# Patient Record
Sex: Female | Born: 2000
Health system: Southern US, Community
[De-identification: ages and names within clinical notes are randomized; demographics above are authoritative.]

## PROBLEM LIST (undated history)

## (undated) DIAGNOSIS — J45909 Unspecified asthma, uncomplicated: Secondary | ICD-10-CM

## (undated) DIAGNOSIS — I10 Essential (primary) hypertension: Secondary | ICD-10-CM

## (undated) DIAGNOSIS — E119 Type 2 diabetes mellitus without complications: Secondary | ICD-10-CM

## (undated) HISTORY — DX: Type 2 diabetes mellitus without complications: E11.9

## (undated) HISTORY — DX: Unspecified asthma, uncomplicated: J45.909

## (undated) HISTORY — DX: Essential (primary) hypertension: I10

---
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
## 2019-01-02 ENCOUNTER — Telehealth (INDEPENDENT_AMBULATORY_CARE_PROVIDER_SITE_OTHER): Payer: Self-pay | Admitting: *Deleted

## 2019-01-02 NOTE — Telephone Encounter (Signed)
Spoke to patient. Advised her that we received paperwork for a Medtronic pump. She states she filled out paperwork on all the pumps but she wants the Omnipod since its tubeless. I advise her I will let Lorena know and I will inform [employer] as well.

## 2019-01-04 ENCOUNTER — Emergency Department (HOSPITAL_COMMUNITY)
Admission: EM | Admit: 2019-01-04 | Discharge: 2019-01-04 | Disposition: A | Discharge status: Home / Self Care | Payer: 59 | Attending: Emergency Medicine | Admitting: Emergency Medicine

## 2019-01-04 ENCOUNTER — Encounter (HOSPITAL_COMMUNITY): Payer: Self-pay | Admitting: Emergency Medicine

## 2019-01-04 ENCOUNTER — Other Ambulatory Visit: Payer: Self-pay

## 2019-01-04 DIAGNOSIS — Z79899 Other long term (current) drug therapy: Secondary | ICD-10-CM | POA: Insufficient documentation

## 2019-01-04 DIAGNOSIS — Z794 Long term (current) use of insulin: Secondary | ICD-10-CM | POA: Insufficient documentation

## 2019-01-04 DIAGNOSIS — J45909 Unspecified asthma, uncomplicated: Secondary | ICD-10-CM | POA: Diagnosis not present

## 2019-01-04 DIAGNOSIS — B9689 Other specified bacterial agents as the cause of diseases classified elsewhere: Secondary | ICD-10-CM

## 2019-01-04 DIAGNOSIS — E119 Type 2 diabetes mellitus without complications: Secondary | ICD-10-CM | POA: Insufficient documentation

## 2019-01-04 DIAGNOSIS — N760 Acute vaginitis: Secondary | ICD-10-CM

## 2019-01-04 DIAGNOSIS — R102 Pelvic and perineal pain: Secondary | ICD-10-CM | POA: Diagnosis present

## 2019-01-04 LAB — CBG MONITORING, ED: Glucose-Capillary: 284 mg/dL — ABNORMAL HIGH (ref 70–99)

## 2019-01-04 LAB — URINALYSIS, ROUTINE W REFLEX MICROSCOPIC
Bacteria, UA: NONE SEEN
Bilirubin Urine: NEGATIVE
Glucose, UA: >=500 mg/dL — AB
Ketones, ur: NEGATIVE mg/dL
Leukocytes,Ua: NEGATIVE
Nitrite: NEGATIVE
Protein, ur: NEGATIVE mg/dL
Specific Gravity, Urine: 1.039 — ABNORMAL HIGH (ref 1.005–1.030)
pH: 6 (ref 5.0–8.0)

## 2019-01-04 LAB — WET PREP, GENITAL
Sperm: NONE SEEN
Trich, Wet Prep: NONE SEEN
Yeast Wet Prep HPF POC: NONE SEEN

## 2019-01-04 LAB — I-STAT BETA HCG BLOOD, ED (MC, WL, AP ONLY): I-stat hCG, quantitative: <5 m[IU]/mL (ref <5)

## 2019-01-04 MED ORDER — METRONIDAZOLE 500 MG PO TABS: 500.0000 mg | ORAL_TABLET | Freq: Two times a day (BID) | ORAL | 0 refills | Status: DC

## 2019-01-04 MED ORDER — KETOCONAZOLE 2 % EX CREA: 1.0000 "application " | TOPICAL_CREAM | Freq: Two times a day (BID) | CUTANEOUS | 0 refills | Status: DC

## 2019-01-04 NOTE — ED Triage Notes (Signed)
Patient complaining of burning when she urinates. Patient states it is cuts on her vagina. Patient states this has been going for a week.

## 2019-01-04 NOTE — Discharge Instructions (Addendum)
Do not drink alcohol with this medication (flagyl) as it can make you violently ill. Manage your blood sugars to avoid infections Use condoms with your partner.

## 2019-01-04 NOTE — ED Notes (Signed)
Patient reports vaginal pain and itching; also reports boyfriend has some "bumps on penis" but not diagnosed with anything

## 2019-01-04 NOTE — ED Notes (Signed)
Bed: WA23 Expected date:  Expected time:  Means of arrival:  Comments: 

## 2019-01-05 LAB — RPR: RPR Ser Ql: NONREACTIVE

## 2019-01-05 LAB — HIV ANTIBODY (ROUTINE TESTING W REFLEX): HIV Screen 4th Generation wRfx: NONREACTIVE

## 2019-01-07 NOTE — ED Provider Notes (Signed)
[place] DEPT Provider Note   CSN: [id number] Arrival date & time: 01/04/19  1924    History   Chief Complaint Chief Complaint  Patient presents with  . Vaginal Pain    HPI Sherri Lopez is a 18 y.o. female.  Who presents the emergency department with chief complaint of vaginal discomfort.  She has a past medical history of recurrent major depressive disorder and poorly controlled insulin-dependent diabetes.  The patient states that a few days ago she noticed burning while she urinates.  She examined her genitals in the mirror and states that she said it looks like "there is cuts" to her vaginal tissue.  She is sexually active with a single female partner.  She does not use barrier protection.  He has noticed blisters on the end of his penis over the last 2 days and she is concerned she might have herpes.  She denies other vaginal complaints.  She states that her blood sugars at home have been "okay" but she is unable to give me a specific value.     HPI  Past Medical History:  Diagnosis Date  . Asthma   . Diabetes mellitus without complication [facility])     Patient Active Problem List   Diagnosis Date Noted  . Uncontrolled type 2 diabetes mellitus with insulin therapy ([place]) 05/31/2016  . Ovarian cyst 05/04/2016  . Asthma 09/30/2015  . BMI, pediatric > 99% for age 31/15/2017  . Secondary amenorrhea 09/30/2015  . Eczema 09/30/2015  . [name] nigricans 09/30/2015  . Hyperandrogenism 09/30/2015  . Acne vulgaris 09/30/2015  . Hirsutism 09/30/2015    History reviewed. No pertinent surgical history.   OB History    Gravida  0   Para  0   Term  0   Preterm  0   AB  0   Living  0     SAB  0   TAB  0   Ectopic  0   Multiple  0   Live Births  0            Home Medications    Prior to Admission medications   Medication Sig Start Date End Date Taking? Authorizing Provider  ACCU-CHEK FASTCLIX LANCETS MISC 1 each by Does  not apply route as directed. Check sugar 6 x daily 08/29/17   Lelon Huh, MD  albuterol (PROVENTIL HFA;VENTOLIN HFA) 108 (90 Base) MCG/ACT inhaler Inhale 1-2 puffs into the lungs every 6 (six) hours as needed for wheezing or shortness of breath. Reported on 02/23/2016    [provider]  amLODipine (NORVASC) 5 MG tablet Take 5 mg by mouth daily.  12/13/17   [provider]  BD PEN NEEDLE NANO U/F 32G X 4 MM MISC USE TO INJECT INSULIN VIA INSULIN PEN 6 TIMES DAILY 12/19/16   Lelon Huh, MD  Continuous Blood Gluc Sensor (DEXCOM G6 SENSOR) MISC 1 each by Does not apply route as directed. 1 sensor every 10 days 09/13/18   Lelon Huh, MD  Continuous Blood Gluc Transmit (DEXCOM G6 TRANSMITTER) MISC 1 each by Does not apply route every 3 (three) months. 09/13/18   Lelon Huh, MD  glucagon 1 MG injection Use for Severe Hypoglycemia . Inject 1 mg intramuscularly if unresponsive, unable to swallow, unconscious and/or has seizure Patient not taking: Reported on 09/13/2018 04/18/17   Lelon Huh, MD  glucose blood (ACCU-CHEK GUIDE) test strip Check Blood sugar 6x daily 08/28/17   Lelon Huh, MD  insulin degludec (TRESIBA  FLEXTOUCH) 100 UNIT/ML SOPN FlexTouch Pen Use up to 50 units daily 12/04/18   Lelon Huh, MD  insulin lispro (HUMALOG KWIKPEN) 100 UNIT/ML KwikPen Up to 50 units/day 11/16/18   Lelon Huh, MD  ketoconazole (NIZORAL) 2 % cream Apply 1 application topically 2 (two) times daily. To the affected area for 10 days 01/04/19   Margarita Mail, PA-C  medroxyPROGESTERone (PROVERA) 10 MG tablet Take 1 tablet (10 mg total) by mouth daily. x10 days per month instead of placebo pills. 05/30/17   Lelon Huh, MD  metFORMIN (GLUCOPHAGE XR) 750 MG 24 hr tablet Take 1 tablet (750 mg total) by mouth 2 (two) times daily. Take 1 tablet twice daily 05/30/17   Lelon Huh, MD  metroNIDAZOLE (FLAGYL) 500 MG tablet Take 1 tablet (500 mg total) by mouth 2 (two) times daily.  One po bid x 7 days 01/04/19   Margarita Mail, PA-C  spironolactone (ALDACTONE) 50 MG tablet Take 1 tablet (50 mg total) by mouth 2 (two) times daily. 05/30/17   Lelon Huh, MD  Libby 28 0.25-35 MG-MCG tablet TAKE 1 TABLET BY MOUTH EVERY DAY 11/15/18   Lelon Huh, MD  triamcinolone cream (KENALOG) 0.1 % Apply 1 application topically 2 (two) times daily as needed (for itching/irritation).     [provider]  Vitamin D, Ergocalciferol, (DRISDOL) 50000 units CAPS capsule TAKE 1 CAPSULE (50,000 UNITS TOTAL) BY MOUTH EVERY 7 (SEVEN) DAYS. Patient not taking: Reported on 12/28/2017 06/05/17   Trude Mcburney, FNP    Family History Family History  Adopted: Yes  Problem Relation Age of Onset  . Polycystic ovary syndrome Mother     Social History Social History   Tobacco Use  . Smoking status: Never Smoker  . Smokeless tobacco: Never Used  Substance Use Topics  . Alcohol use: No    Alcohol/week: 0.0 standard drinks  . Drug use: No     Allergies   Penicillins   Review of Systems Review of Systems Ten systems reviewed and are negative for acute change, except as noted in the HPI.    Physical Exam Updated Vital Signs BP 139/86 (BP Location: Left Arm)   Pulse 79   Temp 99.3 F (37.4 C) (Oral)   Resp 15   Ht 5\' 2"  (1.575 m)   Wt 80.7 kg   LMP 12/04/2018   SpO2 99%   BMI 32.56 kg/m   Physical Exam Physical Exam  Nursing note and vitals reviewed. Constitutional: She is oriented to person, place, and time. She appears well-developed and well-nourished. No distress.  HENT:  Head: Normocephalic and atraumatic.  Eyes: Conjunctivae normal and EOM are normal. Pupils are equal, round, and reactive to light. No scleral icterus.  Neck: Normal range of motion.  Cardiovascular: Normal rate, regular rhythm and normal heart sounds.  Exam reveals no gallop and no friction rub.   No murmur heard. Pulmonary/Chest: Effort normal and breath sounds normal. No  respiratory distress.  Abdominal: Soft. Bowel sounds are normal. She exhibits no distension and no mass. There is no tenderness. There is no guarding. Genitourinary: Normal vulva without lesions.  There is erythematous, irritated appearing tissue at the base of the labia minora bilaterally and at the introitus.  No blisters present..  Normal, pink vaginal walls with thin white discharge.  Nulliparous cervical loss without discharge or cervical motion tenderness.  No adnexal fullness or tenderness. Neurological: She is alert and oriented to person, place, and time.  Skin: Skin is warm and dry. She is  not diaphoretic.     ED Treatments / Results  Labs (all labs ordered are listed, but only abnormal results are displayed) Labs Reviewed  WET PREP, GENITAL - Abnormal; Notable for the following components:      Result Value   Clue Cells Wet Prep HPF POC PRESENT (*)    WBC, Wet Prep HPF POC FEW (*)    All other components within normal limits  URINALYSIS, ROUTINE W REFLEX MICROSCOPIC - Abnormal; Notable for the following components:   Color, Urine STRAW (*)    Specific Gravity, Urine 1.039 (*)    Glucose, UA >=500 (*)    Hgb urine dipstick LARGE (*)    All other components within normal limits  CBG MONITORING, ED - Abnormal; Notable for the following components:   Glucose-Capillary 284 (*)    All other components within normal limits  RPR  HIV ANTIBODY (ROUTINE TESTING W REFLEX)  POC URINE PREG, ED  I-STAT BETA HCG BLOOD, ED ([facility], [facility], [facility] ONLY)  GC/CHLAMYDIA PROBE AMP ([place]) NOT AT [facility]    EKG None  Radiology No results found.  Procedures Procedures (including critical care time)  Medications Ordered in ED Medications - No data to display   Initial Impression / Assessment and Plan / ED Course  I have reviewed the triage vital signs and the nursing notes.  Pertinent labs & imaging results that were available during my care of the patient were reviewed by me and  considered in my medical decision making (see chart for details).         18 year old female who presents with concern for burning with urination, and sexual partner with recent outbreak of suspected HSV.  On my examination I do not see any vesicular eruptions.  I have reviewed the patient's labs which show elevated blood glucose, clue cells on the wet prep, mild hemoglobinuria and elevated glucose urea.  UA does not appear infected.  She is a negative pregnancy test.  Refresh patient will be treated today for bacterial vaginosis.  Her current examination is not appear to show HSV outbreak.  I have warned the patient of the signs.  The rest of her lab work is currently pending.  She was appropriate for discharge with outpatient GYN follow-up, she should follow-up with her PCP for optimize blood sugar control.  I discussed safe sex practices with her partner.  Final Clinical Impressions(s) / ED Diagnoses   Final diagnoses:  Bacterial vaginosis  Acute vaginitis    ED Discharge Orders         Ordered    metroNIDAZOLE (FLAGYL) 500 MG tablet  2 times daily     01/04/19 2303    ketoconazole (NIZORAL) 2 % cream  2 times daily     01/04/19 2303           Margarita Mail, PA-C 01/07/19 1351    Malvin Johns, MD 01/07/19 1501

## 2019-01-08 LAB — GC/CHLAMYDIA PROBE AMP (~~LOC~~) NOT AT ARMC
Chlamydia: NEGATIVE
Neisseria Gonorrhea: NEGATIVE

## 2019-01-24 ENCOUNTER — Telehealth (INDEPENDENT_AMBULATORY_CARE_PROVIDER_SITE_OTHER): Payer: Self-pay | Admitting: Pediatric Endocrinology

## 2019-01-24 NOTE — Telephone Encounter (Signed)
°  Who's calling (name and relationship to patient) : Cyril Mourning [employer] account planner [employer]) Best contact number: [phone] Provider they see: Baldo Ash Reason for call: [facility] needs a letter from Dr. Baldo Ash stating the reasons that Hermine would benefit  from an insulin pump.  Please call with any questions.  [employer] [phone]    PRESCRIPTION REFILL ONLY  Name of prescription:  Pharmacy:

## 2019-01-29 ENCOUNTER — Encounter (INDEPENDENT_AMBULATORY_CARE_PROVIDER_SITE_OTHER): Payer: Self-pay | Admitting: Pediatric Endocrinology

## 2019-01-29 NOTE — Telephone Encounter (Signed)
I'm working on this letter- but I am unclear if [facility] wants a letter saying WHY OMNIPOD or WHY PUMP?  Thanks

## 2019-01-29 NOTE — Telephone Encounter (Signed)
I'm working on this letter- but I am unclear if [facility] wants a letter saying WHY OMNIPOD or WHY PUMP?  Thanks

## 2019-01-30 NOTE — Telephone Encounter (Signed)
I'm working on this letter- but I am unclear if [facility] wants a letter saying WHY OMNIPOD or WHY PUMP?  Thanks

## 2019-01-31 ENCOUNTER — Encounter (INDEPENDENT_AMBULATORY_CARE_PROVIDER_SITE_OTHER): Payer: Self-pay | Admitting: Pediatric Endocrinology

## 2019-01-31 NOTE — Telephone Encounter (Signed)
Yes- I saw it- will try to finish today- I will leave it on your desk.

## 2019-01-31 NOTE — Telephone Encounter (Signed)
Did you get the email from Sherri Lopez, she sent you a template, read her the letter you had prepared and she said it sounds great, but to add what she stated on the letter.

## 2019-02-06 NOTE — Telephone Encounter (Signed)
Completed by Dr. Baldo Ash and sent out by Adair Laundry

## 2019-02-11 ENCOUNTER — Telehealth (INDEPENDENT_AMBULATORY_CARE_PROVIDER_SITE_OTHER): Payer: Self-pay | Admitting: Pediatric Endocrinology

## 2019-02-11 NOTE — Telephone Encounter (Signed)
[employer] is not able to get in touch with Sherri Lopez about insurance approving Omnipod for her.

## 2019-02-12 NOTE — Telephone Encounter (Signed)
LVM on [place] VM advised that [employer] has not been able to reach her, she needs to call them to discuss the pump.

## 2019-02-19 ENCOUNTER — Telehealth (INDEPENDENT_AMBULATORY_CARE_PROVIDER_SITE_OTHER): Payer: Self-pay | Admitting: Pediatric Endocrinology

## 2019-02-19 NOTE — Telephone Encounter (Signed)
°  Who's calling (name and relationship to patient) : Olivia Mackie with [employer]  Best contact number: [phone]  Provider they see: Baldo Ash  Reason for call: Olivia Mackie stated she sent our office a form to be completed for patient to receive a [employer] pump. Please advise if we received this form.      PRESCRIPTION REFILL ONLY  Name of prescription:  Pharmacy:

## 2019-02-19 NOTE — Telephone Encounter (Signed)
Spoke to [place], she advises Sherri Lopez wants a [employer] pump, yet we have done the paperwork for an Omnipod, I have attempted to call [place] and she has no VM set up.  [place] just an [place].

## 2019-02-21 ENCOUNTER — Telehealth (INDEPENDENT_AMBULATORY_CARE_PROVIDER_SITE_OTHER): Payer: Self-pay | Admitting: *Deleted

## 2019-02-21 ENCOUNTER — Telehealth (INDEPENDENT_AMBULATORY_CARE_PROVIDER_SITE_OTHER): Payer: Self-pay | Admitting: Pediatric Endocrinology

## 2019-02-21 NOTE — Telephone Encounter (Signed)
Spoke to mother, advised that I have been trying to reach [place] about which insulin pump she wants. She does not have the VM set up on her phone. Mother advises she will call [place] and have her call after 2 and speak to me.

## 2019-02-21 NOTE — Telephone Encounter (Signed)
°  Who's calling (name and relationship to patient) : Lovena Le (Self)  Best contact number: [phone] Provider they see: Dr. Baldo Ash  Reason for call: Pt would like a return call from Dr. Baldo Ash at her earliest convenience.

## 2019-02-21 NOTE — Telephone Encounter (Signed)
Returned TC to [place], she said that because of the insurance situation, the [employer]onic pump is the only one and more affordable that she can use. She kept saying that she had to use the this pump. Tried to explain to patient that she does not have to go or start on any insulin pump. Also advised her that we hear a lot of patients that are not happy with the [place] sensor. She said this is what she can afford.

## 2019-02-25 NOTE — Telephone Encounter (Signed)
No, [employer] exclusively [employer]'s [employer] is out of the table. [employer] will not argue with [employer].

## 2019-02-25 NOTE — Telephone Encounter (Signed)
It is fine for her to use a [employer]tronic pump- can we call [employer] and ask if there is a work around? I think I would prefer her to be on their system if at all possible.   Dr. Baldo Ash

## 2019-02-25 NOTE — Telephone Encounter (Signed)
Ok- then if she wants a pump CGM combo I guess we're going [employer].Sherri KitchenMarland Lopez

## 2019-03-08 ENCOUNTER — Telehealth (INDEPENDENT_AMBULATORY_CARE_PROVIDER_SITE_OTHER): Payer: Self-pay | Admitting: Pediatric Endocrinology

## 2019-03-08 NOTE — Telephone Encounter (Signed)
°  Who's calling (name and relationship to patient) : [employer]  Best contact number: [phone]. [phone]   Provider they see: Baldo Ash   Reason for call: Stated they faxed [place] a CMN on 02/18/19 for patient to receive a pump. Have not received back from [place] yet.      PRESCRIPTION REFILL ONLY  Name of prescription:  Pharmacy:

## 2019-03-11 NOTE — Telephone Encounter (Signed)
Routed to LI 

## 2019-03-12 NOTE — Telephone Encounter (Signed)
LVM to advise that we have not seen patient in over 6 mos and provider will not sign CMN until patient seen in office.

## 2019-04-05 ENCOUNTER — Ambulatory Visit (INDEPENDENT_AMBULATORY_CARE_PROVIDER_SITE_OTHER): Payer: Self-pay | Admitting: Pediatric Endocrinology

## 2020-06-15 ENCOUNTER — Other Ambulatory Visit (INDEPENDENT_AMBULATORY_CARE_PROVIDER_SITE_OTHER): Payer: Self-pay | Admitting: Pediatric Endocrinology

## 2020-06-15 DIAGNOSIS — E1111 Type 2 diabetes mellitus with ketoacidosis with coma: Secondary | ICD-10-CM

## 2020-06-15 DIAGNOSIS — Z794 Long term (current) use of insulin: Secondary | ICD-10-CM

## 2020-06-15 NOTE — Telephone Encounter (Signed)
Contacted patient and she asks that this referral be sent to [facility] endocrinology as that is where she sent the appointment request to.   Let patient know the last time we saw her for an appointment was 08/2018 which is nearing on almost two years ago. An approval from a provider would have to be made for an insulin refill. Patient states understanding and requests that she be contacted when a decision is made for her refills. Informs [phone] is the best number to contact her at. No DPR on file to leave a detailed VM at this number.

## 2020-06-15 NOTE — Telephone Encounter (Signed)
°  Who's calling (name and relationship to patient) : Shalinda, [place] Best contact number: [phone] Provider they see: Baldo Ash Reason for call: [place] would like a referral to adult endo but will need a refill for her insuline to get her to that appt that is about 3 weeks out.  Please call.    PRESCRIPTION REFILL ONLY  Name of prescription:  Pharmacy:

## 2020-06-16 ENCOUNTER — Telehealth (INDEPENDENT_AMBULATORY_CARE_PROVIDER_SITE_OTHER): Payer: Self-pay | Admitting: Pediatric Endocrinology

## 2020-06-16 NOTE — Telephone Encounter (Signed)
°  Who's calling (name and relationship to patient) :self / Selinda Flavin  Best contact number:[phone]  Provider they see:Dr. Baldo Ash   Reason for call:would like a call back to talk about getting a refill for her insulin and questions about her next appt 11/2 CG      PRESCRIPTION REFILL ONLY  Name of prescription:  Pharmacy:

## 2020-06-18 NOTE — Telephone Encounter (Signed)
Patients next appointment is not scheduled in our office.   Will route this message to provider as patient has not been seen in our office since 08/2018.

## 2020-06-22 ENCOUNTER — Other Ambulatory Visit (INDEPENDENT_AMBULATORY_CARE_PROVIDER_SITE_OTHER): Payer: Self-pay | Admitting: *Deleted

## 2020-06-22 ENCOUNTER — Telehealth (INDEPENDENT_AMBULATORY_CARE_PROVIDER_SITE_OTHER): Payer: Self-pay | Admitting: Pediatric Endocrinology

## 2020-06-22 MED ORDER — INSULIN LISPRO (1 UNIT DIAL) 100 UNIT/ML (KWIKPEN): PEN_INJECTOR | SUBCUTANEOUS | 1 refills | Status: DC

## 2020-06-22 NOTE — Telephone Encounter (Signed)
Spoke to patient, reminder her she has an appt 07/22/20 with Dr. Kelton Pillar at [facility]. Refill sent to get her to that appt. Advised her this would be the last refill and to make sure she keeps her Dec. Appt. She voiced understanding.

## 2020-06-22 NOTE — Telephone Encounter (Signed)
°  Who's calling (name and relationship to patient) Self / Selinda Flavin   Best contact number:[phone]  Provider they see:Dr. Baldo Ash   Reason for call:Needs a medication Refill for insulin if possible and would like a call back     [place]  Name of prescription:insulin   Pharmacy:[facility]

## 2020-07-22 ENCOUNTER — Ambulatory Visit: Payer: 59 | Admitting: Internal Medicine

## 2020-07-22 NOTE — Progress Notes (Deleted)
Name: Sherri Lopez  MRN/ DOB: [id number], 2001/07/27   Age/ Sex: 19 y.o., female    PCP: [name], MD   Reason for Endocrinology Evaluation: Type 2 Diabetes Mellitus     Date of Initial Endocrinology Visit: 07/22/2020     PATIENT IDENTIFIER: Sherri Lopez is a 19 y.o. female with a past medical history of ***. The patient presented for initial endocrinology clinic visit on 07/22/2020 for consultative assistance with her diabetes management.    HPI: Sherri Lopez was    Diagnosed with DM in 2017 Prior Medications tried/Intolerance: *** Currently checking blood sugars *** x / day,  before breakfast and ***.  Hypoglycemia episodes : ***               Symptoms: ***                 Frequency: ***/  Hemoglobin A1c has ranged from 8.4% in 2017, peaking at 12.8% in 2021. Patient required assistance for hypoglycemia:  Patient has required hospitalization within the last 1 year from hyper or hypoglycemia:   In terms of diet, the patient ***   Secondary Amenorrhea: She has a history of irregular menstruation and hirsutism. Menarche at age 54. elevated testosterone ranging between 132-241 . She has been on spironolactone and OCP's . She has dermoid cyst on her left ovary . Gyn has been holding surgery until A1c < 8.0%  HOME DIABETES REGIMEN: Tresiba Novolog    Statin: no ACE-I/ARB: no   METER DOWNLOAD SUMMARY: Date range evaluated: *** Fingerstick Blood Glucose Tests = *** Average Number Tests/Day = *** Overall Mean FS Glucose = *** Standard Deviation = ***  BG Ranges: Low = *** High = ***   Hypoglycemic Events/30 Days: BG < 50 = *** Episodes of symptomatic severe hypoglycemia = ***   DIABETIC COMPLICATIONS: Microvascular complications:   ***  Denies: ***  Last eye exam: Completed   Macrovascular complications:   ***  Denies: CAD, PVD, CVA   PAST HISTORY: Past Medical History:  Past Medical History:  Diagnosis Date  . Asthma   . Diabetes  mellitus without complication [facility])      Past Surgical History: No past surgical history on file.   Social History:  reports that she has never smoked. She has never used smokeless tobacco. She reports that she does not drink alcohol and does not use drugs. Family History:  Family History  Adopted: Yes  Problem Relation Age of Onset  . Polycystic ovary syndrome Mother       HOME MEDICATIONS: Allergies as of 07/22/2020      Reactions   Penicillins Rash, Other (See Comments)   Has patient had a PCN reaction causing immediate rash, facial/tongue/throat swelling, SOB or lightheadedness with hypotension: No Has patient had a PCN reaction causing severe rash involving mucus membranes or skin necrosis: No Has patient had a PCN reaction that required hospitalization No Has patient had a PCN reaction occurring within the last 10 years: No If all of the above answers are "NO", then may proceed with Cephalosporin use.      Medication List       Accurate as of July 22, 2020 12:47 PM. If you have any questions, ask your nurse or doctor.        Accu-Chek FastClix Lancets Misc 1 each by Does not apply route as directed. Check sugar 6 x daily   albuterol 108 (90 Base) MCG/ACT inhaler Commonly known as: VENTOLIN HFA Inhale 1-2 puffs  into the lungs every 6 (six) hours as needed for wheezing or shortness of breath. Reported on 02/23/2016   amLODipine 5 MG tablet Commonly known as: NORVASC Take 5 mg by mouth daily.   BD Pen Needle Nano U/F 32G X 4 MM Misc Generic drug: Insulin Pen Needle USE TO INJECT INSULIN VIA INSULIN PEN 6 TIMES DAILY   Dexcom G6 Sensor Misc 1 each by Does not apply route as directed. 1 sensor every 10 days   Dexcom G6 Transmitter Misc 1 each by Does not apply route every 3 (three) months.   glucagon 1 MG injection Use for Severe Hypoglycemia . Inject 1 mg intramuscularly if unresponsive, unable to swallow, unconscious and/or has seizure   glucose blood  test strip Commonly known as: Accu-Chek Guide Check Blood sugar 6x daily   insulin degludec 100 UNIT/ML FlexTouch Pen Commonly known as: [name] FlexTouch Use up to 50 units daily   insulin lispro 100 UNIT/ML KwikPen Commonly known as: HumaLOG KwikPen Up to 50 units/day   ketoconazole 2 % cream Commonly known as: NIZORAL Apply 1 application topically 2 (two) times daily. To the affected area for 10 days   medroxyPROGESTERone 10 MG tablet Commonly known as: PROVERA Take 1 tablet (10 mg total) by mouth daily. x10 days per month instead of placebo pills.   metFORMIN 750 MG 24 hr tablet Commonly known as: Glucophage XR Take 1 tablet (750 mg total) by mouth 2 (two) times daily. Take 1 tablet twice daily   metroNIDAZOLE 500 MG tablet Commonly known as: Flagyl Take 1 tablet (500 mg total) by mouth 2 (two) times daily. One po bid x 7 days   spironolactone 50 MG tablet Commonly known as: ALDACTONE Take 1 tablet (50 mg total) by mouth 2 (two) times daily.   Sprintec 28 0.25-35 MG-MCG tablet Generic drug: norgestimate-ethinyl estradiol TAKE 1 TABLET BY MOUTH EVERY DAY   triamcinolone 0.1 % Commonly known as: KENALOG Apply 1 application topically 2 (two) times daily as needed (for itching/irritation).   Vitamin D (Ergocalciferol) 1.25 MG (50000 UNIT) Caps capsule Commonly known as: DRISDOL TAKE 1 CAPSULE (50,000 UNITS TOTAL) BY MOUTH EVERY 7 (SEVEN) DAYS.        ALLERGIES: Allergies  Allergen Reactions  . Penicillins Rash and Other (See Comments)    Has patient had a PCN reaction causing immediate rash, facial/tongue/throat swelling, SOB or lightheadedness with hypotension: No Has patient had a PCN reaction causing severe rash involving mucus membranes or skin necrosis: No Has patient had a PCN reaction that required hospitalization No Has patient had a PCN reaction occurring within the last 10 years: No If all of the above answers are "NO", then may proceed with  Cephalosporin use.     REVIEW OF SYSTEMS: A comprehensive ROS was conducted with the patient and is negative except as per HPI and below:  ROS    OBJECTIVE:   VITAL SIGNS: There were no vitals taken for this visit.   PHYSICAL EXAM:  General: Pt appears well and is in NAD  Hydration: Well-hydrated with moist mucous membranes and good skin turgor  HEENT: Head: Unremarkable with good dentition. Oropharynx clear without exudate.  Eyes: External eye exam normal without stare, lid lag or exophthalmos.  EOM intact.  PERRL.  Neck: General: Supple without adenopathy or carotid bruits. Thyroid: Thyroid size normal.  No goiter or nodules appreciated. No thyroid bruit.  Lungs: Clear with good BS bilat with no rales, rhonchi, or wheezes  Heart: RRR with normal  S1 and S2 and no gallops; no murmurs; no rub  Abdomen: Normoactive bowel sounds, soft, nontender, without masses or organomegaly palpable  Extremities:  Lower extremities - No pretibial edema. No lesions.  Skin: Normal texture and temperature to palpation. No rash noted. No [name] nigricans/skin tags. No lipohypertrophy.  Neuro: MS is good with appropriate affect, pt is alert and Ox3    DM foot exam:    DATA REVIEWED:  Lab Results  Component Value Date   HGBA1C 12.8 (A) 09/13/2018   HGBA1C 10.3 (A) 01/15/2018   HGBA1C 9.9 08/01/2017   Lab Results  Component Value Date   LDLCALC 76 09/26/2017   CREATININE 0.75 09/13/2018   No results found for: [facility]  Lab Results  Component Value Date   CHOL 133 09/26/2017   HDL 44 (L) 09/26/2017   LDLCALC 76 09/26/2017   TRIG 52 09/26/2017   CHOLHDL 3.0 09/26/2017      Results for EMILLEE, TALSMA (MRN [id number]) as of 07/22/2020 12:51  Ref. Range 09/30/2015 15:18  DHEA-SO4 Latest Ref Range: 37 - 307 ug/dL 147  LH Latest Units: mIU/mL 12.5  FSH Latest Units: mIU/mL 5.8  Prolactin Latest Units: ng/mL 13.1  Glucose Latest Ref Range: 65 - 99 mg/dL 148 (H)  Hemoglobin A1C  Latest Ref Range: <5.7 % 8.9 (H)  Estradiol Latest Units: pg/mL 67  Estradiol, Free Latest Units: pg/mL 1.33  17-OH-Progesterone, LC/MS/MS Latest Ref Range: 16 - 283 ng/dL 212  TSH Latest Ref Range: 0.50 - 4.30 mIU/L 1.75  T4,Free(Direct) Latest Ref Range: 0.8 - 1.4 ng/dL 1.1   Normal Cosyntropin stimulation test 2017 base 8.3- 60 minutes cortisol at 35.2  17-OH cosyntropin stim test also normal with baseline 178 , 60 minute at 398  ASSESSMENT / PLAN / RECOMMENDATIONS:   1) Type *** Diabetes Mellitus, ***controlled, With*** complications - Most recent A1c of *** %. Goal A1c < *** %.  ***  Plan: GENERAL:  ***  MEDICATIONS:  ***  EDUCATION / INSTRUCTIONS:  BG monitoring instructions: Patient is instructed to check her blood sugars *** times a day, ***.  Call [facility] [facility] if: BG persistently < 70 or > 300. . I reviewed the Rule of 15 for the treatment of hypoglycemia in detail with the patient. Literature supplied.   2) Diabetic complications:   Eye: Does *** have known diabetic retinopathy.   Neuro/ Feet: Does *** have known diabetic peripheral neuropathy.  Renal: Patient does *** have known baseline CKD. She is *** on an ACEI/ARB at present.Check urine albumin/creatinine ratio yearly starting at time of diagnosis. If albuminuria is positive, treatment is geared toward better glucose, blood pressure control and use of ACE inhibitors or ARBs. Monitor electrolytes and creatinine once to twice yearly.   3) Lipids: Patient is *** on a statin.    4) Hypertension: ***  at goal of < 140/90 mmHg.       Signed electronically by: Mack Guise, MD  [facility] Endocrinology  [facility] [address] [place], [place] [address] Phone: [phone] FAX: [phone]   CC: [name], MD [place], [place] [address] Phone: [phone]  Fax:  [phone]    Return to Endocrinology clinic as below: Future Appointments  Date Time Provider [place]  07/22/2020  2:00 PM Priyah Schmuck, Melanie Crazier, MD [facility] None

## 2020-07-27 ENCOUNTER — Encounter: Payer: Self-pay | Admitting: Internal Medicine

## 2020-08-29 ENCOUNTER — Other Ambulatory Visit (INDEPENDENT_AMBULATORY_CARE_PROVIDER_SITE_OTHER): Payer: Self-pay | Admitting: Pediatric Endocrinology

## 2020-10-09 ENCOUNTER — Ambulatory Visit: Payer: 59 | Admitting: Internal Medicine

## 2020-10-09 NOTE — Progress Notes (Deleted)
Name: Sherri Lopez  MRN/ DOB: [id number], 2000-10-05   Age/ Sex: 20 y.o., female    PCP: [name], MD   Reason for Endocrinology Evaluation: Type 2 Diabetes Mellitus     Date of Initial Endocrinology Visit: 10/09/2020     PATIENT IDENTIFIER: Sherri Lopez is a 20 y.o. female with a past medical history of T2Dm, Asthma and PCOS . The patient presented for initial endocrinology clinic visit on 10/09/2020 for consultative assistance with her diabetes management.    HPI: Sherri Lopez was    Diagnosed with DM in 2017 Prior Medications tried/Intolerance: *** Currently checking blood sugars *** x / day,  before breakfast and ***.  Hypoglycemia episodes : ***               Symptoms: ***                 Frequency: ***/  Hemoglobin A1c has ranged from 8.7% in 2017, peaking at 12.8% in 2020. Patient required assistance for hypoglycemia:  Patient has required hospitalization within the last 1 year from hyper or hypoglycemia:   In terms of diet, the patient ***    HYPERANDROGENISM HISTORY  She was following with pediatric for hyperandogenism ranging 132-241 . She had a pelvic ultrasound showing left ovarian mass at 1.6x2.0x2.0 cm . She has been managed with Sprintec, metformin and    She has a negative cosyntropin stim test for non -classical with  17-OH baseline at 178  With an hour repeat of 398 Normal DHEAS and Androstenedione  HOME DIABETES REGIMEN: Tresiba  HUmalog  Metformin 750 mg XR   Statin: {Yes/No:11203} ACE-I/ARB: {YES/NO:17245} Prior Diabetic Education: {Yes/No:11203}   METER DOWNLOAD SUMMARY: Date range evaluated: *** Fingerstick Blood Glucose Tests = *** Average Number Tests/Day = *** Overall Mean FS Glucose = *** Standard Deviation = ***  BG Ranges: Low = *** High = ***   Hypoglycemic Events/30 Days: BG < 50 = *** Episodes of symptomatic severe hypoglycemia = ***   DIABETIC COMPLICATIONS: Microvascular complications:   ***  Denies:  CKD  Last eye exam: Completed   Macrovascular complications:    Denies: CAD, PVD, CVA   PAST HISTORY: Past Medical History:  Past Medical History:  Diagnosis Date  . Asthma   . Diabetes mellitus without complication [facility])      Past Surgical History: No past surgical history on file.   Social History:  reports that she has never smoked. She has never used smokeless tobacco. She reports that she does not drink alcohol and does not use drugs. Family History:  Family History  Adopted: Yes  Problem Relation Age of Onset  . Polycystic ovary syndrome Mother       HOME MEDICATIONS: Allergies as of 10/09/2020      Reactions   Penicillins Rash, Other (See Comments)   Has patient had a PCN reaction causing immediate rash, facial/tongue/throat swelling, SOB or lightheadedness with hypotension: No Has patient had a PCN reaction causing severe rash involving mucus membranes or skin necrosis: No Has patient had a PCN reaction that required hospitalization No Has patient had a PCN reaction occurring within the last 10 years: No If all of the above answers are "NO", then may proceed with Cephalosporin use.      Medication List       Accurate as of October 09, 2020  1:00 PM. If you have any questions, ask your nurse or doctor.        Accu-Chek FastClix Lancets  Misc 1 each by Does not apply route as directed. Check sugar 6 x daily   albuterol 108 (90 Base) MCG/ACT inhaler Commonly known as: VENTOLIN HFA Inhale 1-2 puffs into the lungs every 6 (six) hours as needed for wheezing or shortness of breath. Reported on 02/23/2016   amLODipine 5 MG tablet Commonly known as: NORVASC Take 5 mg by mouth daily.   BD Pen Needle Nano U/F 32G X 4 MM Misc Generic drug: Insulin Pen Needle USE TO INJECT INSULIN VIA INSULIN PEN 6 TIMES DAILY   Dexcom G6 Sensor Misc 1 each by Does not apply route as directed. 1 sensor every 10 days   Dexcom G6 Transmitter Misc 1 each by Does not apply  route every 3 (three) months.   glucagon 1 MG injection Use for Severe Hypoglycemia . Inject 1 mg intramuscularly if unresponsive, unable to swallow, unconscious and/or has seizure   glucose blood test strip Commonly known as: Accu-Chek Guide Check Blood sugar 6x daily   insulin degludec 100 UNIT/ML FlexTouch Pen Commonly known as: [name] FlexTouch Use up to 50 units daily   insulin lispro 100 UNIT/ML KwikPen Commonly known as: HumaLOG KwikPen INJECT 50 UNITS ONCE A DAY   ketoconazole 2 % cream Commonly known as: NIZORAL Apply 1 application topically 2 (two) times daily. To the affected area for 10 days   medroxyPROGESTERone 10 MG tablet Commonly known as: PROVERA Take 1 tablet (10 mg total) by mouth daily. x10 days per month instead of placebo pills.   metFORMIN 750 MG 24 hr tablet Commonly known as: Glucophage XR Take 1 tablet (750 mg total) by mouth 2 (two) times daily. Take 1 tablet twice daily   metroNIDAZOLE 500 MG tablet Commonly known as: Flagyl Take 1 tablet (500 mg total) by mouth 2 (two) times daily. One po bid x 7 days   spironolactone 50 MG tablet Commonly known as: ALDACTONE Take 1 tablet (50 mg total) by mouth 2 (two) times daily.   Sprintec 28 0.25-35 MG-MCG tablet Generic drug: norgestimate-ethinyl estradiol TAKE 1 TABLET BY MOUTH EVERY DAY   triamcinolone 0.1 % Commonly known as: KENALOG Apply 1 application topically 2 (two) times daily as needed (for itching/irritation).   Vitamin D (Ergocalciferol) 1.25 MG (50000 UNIT) Caps capsule Commonly known as: DRISDOL TAKE 1 CAPSULE (50,000 UNITS TOTAL) BY MOUTH EVERY 7 (SEVEN) DAYS.        ALLERGIES: Allergies  Allergen Reactions  . Penicillins Rash and Other (See Comments)    Has patient had a PCN reaction causing immediate rash, facial/tongue/throat swelling, SOB or lightheadedness with hypotension: No Has patient had a PCN reaction causing severe rash involving mucus membranes or skin necrosis:  No Has patient had a PCN reaction that required hospitalization No Has patient had a PCN reaction occurring within the last 10 years: No If all of the above answers are "NO", then may proceed with Cephalosporin use.     REVIEW OF SYSTEMS: A comprehensive ROS was conducted with the patient and is negative except as per HPI and below:  ROS    OBJECTIVE:   VITAL SIGNS: There were no vitals taken for this visit.   PHYSICAL EXAM:  General: Pt appears well and is in NAD  Hydration: Well-hydrated with moist mucous membranes and good skin turgor  HEENT: Head: Unremarkable with good dentition. Oropharynx clear without exudate.  Eyes: External eye exam normal without stare, lid lag or exophthalmos.  EOM intact.  PERRL.  Neck: General: Supple without adenopathy or  carotid bruits. Thyroid: Thyroid size normal.  No goiter or nodules appreciated. No thyroid bruit.  Lungs: Clear with good BS bilat with no rales, rhonchi, or wheezes  Heart: RRR with normal S1 and S2 and no gallops; no murmurs; no rub  Abdomen: Normoactive bowel sounds, soft, nontender, without masses or organomegaly palpable  Extremities:  Lower extremities - No pretibial edema. No lesions.  Skin: Normal texture and temperature to palpation. No rash noted. No [name] nigricans/skin tags. No lipohypertrophy.  Neuro: MS is good with appropriate affect, pt is alert and Ox3    DM foot exam:    DATA REVIEWED:  Lab Results  Component Value Date   HGBA1C 12.8 (A) 09/13/2018   HGBA1C 10.3 (A) 01/15/2018   HGBA1C 9.9 08/01/2017   Lab Results  Component Value Date   LDLCALC 76 09/26/2017   CREATININE 0.75 09/13/2018   No results found for: [facility]  Lab Results  Component Value Date   CHOL 133 09/26/2017   HDL 44 (L) 09/26/2017   LDLCALC 76 09/26/2017   TRIG 52 09/26/2017   CHOLHDL 3.0 09/26/2017        ASSESSMENT / PLAN / RECOMMENDATIONS:   1) Type *** Diabetes Mellitus, ***controlled, With*** complications  - Most recent A1c of *** %. Goal A1c < *** %.  ***  Plan: GENERAL:  ***  MEDICATIONS:  ***  EDUCATION / INSTRUCTIONS:  BG monitoring instructions: Patient is instructed to check her blood sugars *** times a day, ***.  Call [facility] [facility] if: BG persistently < 70 or > 300. . I reviewed the Rule of 15 for the treatment of hypoglycemia in detail with the patient. Literature supplied.   2) Diabetic complications:   Eye: Does *** have known diabetic retinopathy.   Neuro/ Feet: Does *** have known diabetic peripheral neuropathy.  Renal: Patient does *** have known baseline CKD. She is *** on an ACEI/ARB at present.Check urine albumin/creatinine ratio yearly starting at time of diagnosis. If albuminuria is positive, treatment is geared toward better glucose, blood pressure control and use of ACE inhibitors or ARBs. Monitor electrolytes and creatinine once to twice yearly.   3) Lipids: Patient is *** on a statin.    4) Hypertension: ***  at goal of < 140/90 mmHg.       Signed electronically by: Mack Guise, MD  [facility] Endocrinology  [facility] [address] [place], [place] [address] Phone: [phone] FAX: [phone]   CC: [name], MD [place], [place] [address] Phone: [phone]  Fax: [phone]    Return to Endocrinology clinic as below: Future Appointments  Date Time Provider [place]  10/09/2020  2:00 PM Myrtis Maille, Melanie Crazier, MD [facility] None

## 2021-05-17 ENCOUNTER — Other Ambulatory Visit: Payer: Self-pay

## 2021-05-17 ENCOUNTER — Inpatient Hospital Stay (HOSPITAL_COMMUNITY)
Admission: AD | Admit: 2021-05-17 | Discharge: 2021-05-17 | Disposition: A | Discharge status: Home / Self Care | Payer: 59 | Attending: Family Medicine | Admitting: Family Medicine

## 2021-05-17 DIAGNOSIS — N912 Amenorrhea, unspecified: Secondary | ICD-10-CM | POA: Insufficient documentation

## 2021-05-17 DIAGNOSIS — N926 Irregular menstruation, unspecified: Secondary | ICD-10-CM

## 2021-05-17 NOTE — Discharge Instructions (Signed)
  [place] for [employer] at [facility]  [address] [place], [place] [address]  [place] for [place] at [address], [place], [place] [address]  [place] for [place] at [facility] [address] [place], [place] [address]  [place] for [place] at [facility]  [address] [place], [place] [address]  [phone]  [place] for [place] at [facility] for [place] (First floor), [place], [place] [address]  [place] for [facility] at [place], [place], [place] [address] [place] for [place] at [facility]  [place], [place], [place] [address]  [phone]  [facility]  [address], [place], [place] [address]  [place]  [place], [place], [place] [address]  [phone]  [facility]  [address] Jaclynn Guarneri [place], [place] [address]  [place], [place] [address]  [phone]  [facility]  [address], [place], [place] [address]  [phone]  [facility]   [place], [place], [place] [address]  [phone]  [facility] [address], [place], [place] [address]   [phone]  [facility]  [address], [place], [place] [address]  [phone]

## 2021-05-17 NOTE — MAU Provider Note (Signed)
  S:   20 y.o. G0P0000 @Unknown  by LMP presents to MAU for pregnancy confirmation.  She denies abdominal pain or vaginal bleeding today.    O: BP (!) 150/80   Pulse 78   Temp 98.1 F (36.7 C)   Resp 18   Ht 5\' 2"  (1.575 m)   Wt 80.7 kg   LMP 03/27/2021   BMI 32.56 kg/m  Physical Examination: General appearance - alert, well appearing, and in no distress, oriented to person, place, and time and acyanotic, in no respiratory distress  No results found for this or any previous visit (from the past 48 hour(s)).  A: Missed menses  P: D/C home Informed pt we do not do pregnancy verifications in MAU Referred to [facility] for pregnancy test & verification Return to MAU as needed for pregnancy related emergencies  Jorje Guild, NP 11:29 AM

## 2021-05-17 NOTE — MAU Note (Signed)
Pt reports she had a positive pregnancy test at home. [facility] and they [facility] to her PCOS and type 2 Diabetes . Pt has no complaints at this time.

## 2021-05-18 ENCOUNTER — Ambulatory Visit (INDEPENDENT_AMBULATORY_CARE_PROVIDER_SITE_OTHER): Payer: 59

## 2021-05-18 VITALS — BP 135/60 | HR 65 | Ht 62.0 in | Wt 178.0 lb

## 2021-05-18 DIAGNOSIS — Z3201 Encounter for pregnancy test, result positive: Secondary | ICD-10-CM

## 2021-05-18 LAB — POCT PREGNANCY, URINE: Preg Test, Ur: POSITIVE — AB

## 2021-05-18 NOTE — Progress Notes (Signed)
I have reviewed the chart and agree with the nurse's note and plan of care for this visit.   Fatima Blank, CNM 5:11 PM

## 2021-05-18 NOTE — Progress Notes (Signed)
Pt dropped off urine today for UPT. UPT is positive.    Pt denies any vaginal bleeding, abd pain or cramps at this time. Pt states has already started taking PNV. Pt advised of Mom/baby Dyad program and is interested in enrolling. Pt enrolled into program. Pt given new OB intake appt and New OB appt. Pt also given safe medications list. Pt denies any nausea, vomiting at this time.   LMP: 03/27/2021 EDC: 01/01/2022 [redacted]w[redacted]d   Cailean Heacock, RN

## 2021-05-18 NOTE — Patient Instructions (Addendum)
Safe Medications in Pregnancy   Acne:  Benzoyl Peroxide  Salicylic Acid   Backache/Headache:  Tylenol: 2 regular strength every 4 hours OR               2 Extra strength every 6 hours   Colds/Coughs/Allergies:  Benadryl (alcohol free) 25 mg every 6 hours as needed  Breath right strips  Claritin  Cepacol throat lozenges  Chloraseptic throat spray  Cold-Eeze- up to three times per day  Cough drops, alcohol free  Flonase (by prescription only)  Guaifenesin  Mucinex  Robitussin DM (plain only, alcohol free)  Saline nasal spray/drops  Sudafed (pseudoephedrine) & Actifed * use only after [redacted] weeks gestation and if you do not have high blood pressure  Tylenol  Vicks Vaporub  Zinc lozenges  Zyrtec   Constipation:  Colace  Ducolax suppositories  Fleet enema  Glycerin suppositories  Metamucil  Milk of magnesia  Miralax  Senokot  Smooth move tea   Diarrhea:  Kaopectate  Imodium A-D   *NO pepto Bismol   Hemorrhoids:  Anusol  Anusol HC  Preparation H  Tucks   Indigestion:  Tums  Maalox  Mylanta  Zantac  Pepcid   Insomnia:  Benadryl (alcohol free) 25mg  every 6 hours as needed  Tylenol PM  Unisom, no Gelcaps   Leg Cramps:  Tums  MagGel   Nausea/Vomiting:  Bonine  Dramamine  Emetrol  Ginger extract  Sea bands  Meclizine  Nausea medication to take during pregnancy:  Unisom (doxylamine succinate 25 mg tablets) Take one tablet daily at bedtime. If symptoms are not adequately controlled, the dose can be increased to a maximum recommended dose of two tablets daily (1/2 tablet in the morning, 1/2 tablet mid-afternoon and one at bedtime).  Vitamin B6 100mg  tablets. Take one tablet twice a day (up to 200 mg per day).   Skin Rashes:  Aveeno products  Benadryl cream or 25mg  every 6 hours as needed  Calamine Lotion  1% cortisone cream   Yeast infection:  Gyne-lotrimin 7  Monistat 7    **If taking multiple medications, please check labels to avoid  duplicating the same active ingredients  **take medication as directed on the label  ** Do not exceed 4000 mg of tylenol in 24 hours  **Do not take medications that contain aspirin or ibuprofen          P[facility] [place] [facility] [place] [facility]  [place] [phone]

## 2021-05-19 ENCOUNTER — Telehealth (INDEPENDENT_AMBULATORY_CARE_PROVIDER_SITE_OTHER): Payer: 59

## 2021-05-19 DIAGNOSIS — Z3A00 Weeks of gestation of pregnancy not specified: Secondary | ICD-10-CM

## 2021-05-19 DIAGNOSIS — O0990 Supervision of high risk pregnancy, unspecified, unspecified trimester: Secondary | ICD-10-CM | POA: Insufficient documentation

## 2021-05-19 DIAGNOSIS — O169 Unspecified maternal hypertension, unspecified trimester: Secondary | ICD-10-CM

## 2021-05-19 NOTE — Progress Notes (Signed)
New OB Intake  I connected with  Sherri Lopez on 05/19/21 at 11:15 AM EDT by telephone visit and verified that I am speaking with the correct person using two identifiers. Nurse is located at [facility] and pt is located at home. Length of telephone appointment was approx. 25 minutes.  I discussed the limitations, risks, security and privacy concerns of performing an evaluation and management service by telephone and the availability of in person appointments. I also discussed with the patient that there may be a patient responsible charge related to this service. The patient expressed understanding and agreed to proceed.  I explained I am completing New OB Intake today. We discussed her EDD of 01/01/22 that is based on LMP of 03/27/21. Pt is G1/P0. I reviewed her allergies, medications, Medical/Surgical/OB history, and appropriate screenings. I informed her of [facility] services. [facility] information placed in AVS. Based on history, this is a high risk pregnancy.   Patient Active Problem List   Diagnosis Date Noted   Supervision of high risk pregnancy, antepartum 05/19/2021   Polycystic ovarian syndrome 12/13/2017   Essential hypertension 01/01/2017   Uncontrolled type 2 diabetes mellitus with insulin therapy 05/31/2016   Ovarian cyst 05/04/2016   Asthma 09/30/2015   BMI, pediatric > 99% for age 64/15/2017   Secondary amenorrhea 09/30/2015   Eczema 09/30/2015   [name] nigricans 09/30/2015   Hyperandrogenism 09/30/2015   Acne vulgaris 09/30/2015   Hirsutism 09/30/2015   Concerns addressed today  Reports taking spironolactone for hypertension; explained this is not recommended during pregnancy. Pt will return next week for BP check. Explained that we will follow BP and determine if patient needs a new blood pressure medication.   Delivery Plans:  Plans to deliver at [facility].   MyChart/Babyscripts MyChart access verified. Babyscripts instructions given and order placed.  Blood Pressure Cuff   Patient has private insurance; instructed to purchase blood pressure cuff and bring to first prenatal appt. Explained after first prenatal appt pt will check weekly and document in 70.  Anatomy US Explained first scheduled [place] will be around 19 weeks. Anatomy US scheduled for 08/10/21 at 1030.  Labs Discussed [name] genetic screening with patient. Would like both Panorama and Horizon drawn at new OB visit. Routine prenatal labs needed.  COVID Vaccine Patient has not had COVID vaccine.   Mother/ Baby Dyad Candidate?    Yes, patient has been scheduled.   Social Determinants of Health Food Insecurity: Patient denies food insecurity. WIC Referral: Patient is interested in referral to [employer].  Transportation: Patient denies transportation needs. Childcare: Discussed no children allowed at ultrasound appointments. Offered childcare services; patient declines childcare services at this time.  First visit review I reviewed new OB appt with pt. I explained she will have a provider visit with PAP smear and ob blood work. Explained pt will be seen by Ernestina Patches, MD at first visit; encounter routed to appropriate provider. Explained that patient will be seen by pregnancy navigator following visit with provider.   Annabell Howells, RN 05/19/2021  11:56 AM

## 2021-05-26 NOTE — Progress Notes (Signed)
Attestation of Attending Supervision of clinical support staff: I agree with the care provided to this patient and was available for any consultation.  I have reviewed the RN's note and chart. I was available for consult and to see the patient if needed.   Antoria Lanza Niles Markies Mowatt, MD, MPH, ABFM Attending Physician Faculty Practice- [facility]  

## 2021-05-27 ENCOUNTER — Other Ambulatory Visit: Payer: Self-pay

## 2021-05-27 ENCOUNTER — Other Ambulatory Visit: Payer: 59

## 2021-05-27 ENCOUNTER — Telehealth: Payer: Self-pay

## 2021-05-27 ENCOUNTER — Ambulatory Visit: Payer: 59

## 2021-05-27 DIAGNOSIS — O24119 Pre-existing diabetes mellitus, type 2, in pregnancy, unspecified trimester: Secondary | ICD-10-CM

## 2021-05-27 NOTE — Telephone Encounter (Signed)
Called pt to follow up on missed BP check and diabetes education appts today. Unable to leave VM because box is full. MyChart message has already been sent by front office.

## 2021-05-28 ENCOUNTER — Telehealth: Payer: Self-pay

## 2021-05-28 DIAGNOSIS — O0990 Supervision of high risk pregnancy, unspecified, unspecified trimester: Secondary | ICD-10-CM

## 2021-05-28 NOTE — Telephone Encounter (Signed)
Received call from babyscripts of elevated BP of 143/86 with no symptoms. Call placed to pt.Spoke with pt. Pt asked about BP symtpoms. Denies all. Pt retook BP and was 137/81. Pt advised to continue to monitor symptoms and retake if has headaches, dizziness, visual changes or swelling. Pt verbalized understanding.   Pt also states [facility] [place] [facility] on 10/11 and was only [redacted]w[redacted]d instead of what original LMP. Pt had EDC changed to 01/14/22. Changed in epic. Pt also had rescheduled new OB appt. Pt agreeable to plan of care.  Pt also rescheduled for Diabetes education with [facility] Dy d/t T2DM with Insulin and Metformin. Pt has appt on 06/08/21. Pt agreeable to date and time of appt.   Colletta Maryland, RN

## 2021-06-08 ENCOUNTER — Other Ambulatory Visit: Payer: 59

## 2021-06-23 ENCOUNTER — Other Ambulatory Visit: Payer: 59

## 2021-08-03 ENCOUNTER — Other Ambulatory Visit: Payer: 59 | Admitting: Registered"

## 2021-08-04 ENCOUNTER — Other Ambulatory Visit: Payer: Self-pay

## 2021-08-04 ENCOUNTER — Other Ambulatory Visit (HOSPITAL_COMMUNITY)
Admission: RE | Admit: 2021-08-04 | Discharge: 2021-08-04 | Disposition: A | Discharge status: Home / Self Care | Payer: 59 | Source: Ambulatory Visit | Attending: Family Medicine | Admitting: Family Medicine

## 2021-08-04 ENCOUNTER — Ambulatory Visit (INDEPENDENT_AMBULATORY_CARE_PROVIDER_SITE_OTHER): Payer: 59 | Admitting: Family Medicine

## 2021-08-04 VITALS — BP 135/84 | HR 90 | Wt 184.7 lb

## 2021-08-04 DIAGNOSIS — O10919 Unspecified pre-existing hypertension complicating pregnancy, unspecified trimester: Secondary | ICD-10-CM

## 2021-08-04 DIAGNOSIS — O0990 Supervision of high risk pregnancy, unspecified, unspecified trimester: Secondary | ICD-10-CM

## 2021-08-04 DIAGNOSIS — Z124 Encounter for screening for malignant neoplasm of cervix: Secondary | ICD-10-CM | POA: Insufficient documentation

## 2021-08-04 DIAGNOSIS — E119 Type 2 diabetes mellitus without complications: Secondary | ICD-10-CM

## 2021-08-04 DIAGNOSIS — Z794 Long term (current) use of insulin: Secondary | ICD-10-CM

## 2021-08-04 DIAGNOSIS — F32A Depression, unspecified: Secondary | ICD-10-CM

## 2021-08-04 LAB — OB RESULTS CONSOLE GC/CHLAMYDIA: Gonorrhea: NEGATIVE

## 2021-08-04 LAB — OB RESULTS CONSOLE GBS: GBS: POSITIVE

## 2021-08-04 LAB — GLUCOSE, CAPILLARY: Glucose-Capillary: 211 mg/dL — ABNORMAL HIGH (ref 70–99)

## 2021-08-04 MED ORDER — ASPIRIN EC 81 MG PO TBEC: 81.0000 mg | DELAYED_RELEASE_TABLET | Freq: Every day | ORAL | 3 refills | Status: DC

## 2021-08-04 NOTE — Progress Notes (Signed)
[facility] New OB Visit   Subjective:   Sherri Lopez is a 20 y.o. G1P0000 at 41w5dby early ultrasound being seen today for her first obstetrical visit.  Her obstetrical history is significant for  cHTN, DM2, obesity . Patient does intend to breast feed. Pregnancy history fully reviewed.  Patient reports no complaints.  HISTORY: OB History  Gravida Para Term Preterm AB Living  1 0 0 0 0 0  SAB IAB Ectopic Multiple Live Births  0 0 0 0 0    # Outcome Date GA Lbr Len/2nd Weight Sex Delivery Anes PTL Lv  1 Current              Last pap smear: No results found for: DIAGPAP, HPV, HPVHIGH *needs*  Past Medical History:  Diagnosis Date   Asthma    Diabetes mellitus [place] complication [place]    History reviewed. No pertinent surgical history. Family History  Adopted: Yes  Problem Relation Age of Onset   Polycystic ovary syndrome Mother    Arthritis Mother    Thyroid disease Mother    Lupus Maternal Aunt    Social History   Tobacco Use   Smoking status: Never   Smokeless tobacco: Never  Substance Use Topics   Alcohol use: No    Alcohol/week: 0.0 standard drinks   Drug use: No   Allergies  Allergen Reactions   Penicillins Rash and Other (See Comments)    Has patient had a PCN reaction causing immediate rash, facial/tongue/throat swelling, SOB or lightheadedness with hypotension: No Has patient had a PCN reaction causing severe rash involving mucus membranes or skin necrosis: No Has patient had a PCN reaction that required hospitalization No Has patient had a PCN reaction occurring within the last 10 years: No If all of the above answers are "NO", then may proceed with Cephalosporin use.   Current Outpatient Medications on File Prior to Visit  Medication Sig Dispense Refill   ACCU-CHEK FASTCLIX LANCETS MISC 1 each by Does not apply route as directed. Check sugar 6 x daily 204 each 3   albuterol (PROVENTIL HFA;VENTOLIN HFA) 108 (90 Base) MCG/ACT  inhaler Inhale 1-2 puffs into the lungs every 6 (six) hours as needed for wheezing or shortness of breath. Reported on 02/23/2016     BD PEN NEEDLE NANO U/F 32G X 4 MM MISC USE TO INJECT INSULIN VIA INSULIN PEN 6 TIMES DAILY 600 each 3   glucagon 1 MG injection Use for Severe Hypoglycemia . Inject 1 mg intramuscularly if unresponsive, unable to swallow, unconscious and/or has seizure 1 kit 3   insulin degludec (TRESIBA FLEXTOUCH) 100 UNIT/ML SOPN FlexTouch Pen Use up to 50 units daily 45 mL 1   insulin lispro (HUMALOG KWIKPEN) 100 UNIT/ML KwikPen INJECT 50 UNITS ONCE A DAY 15 mL 1   metFORMIN (GLUCOPHAGE XR) 750 MG 24 hr tablet Take 1 tablet (750 mg total) by mouth 2 (two) times daily. Take 1 tablet twice daily 180 tablet 3   prenatal vitamin w/FE, FA (NATACHEW) 29-1 MG CHEW chewable tablet Chew 1 tablet by mouth daily at 12 noon.     Vitamin D, Ergocalciferol, (DRISDOL) 50000 units CAPS capsule TAKE 1 CAPSULE (50,000 UNITS TOTAL) BY MOUTH EVERY 7 (SEVEN) DAYS. 12 capsule 0   No current facility-administered medications on file prior to visit.     Exam   Vitals:   08/04/21 1351  BP: 135/84  Pulse: 90  Weight: 184 lb 11.2 oz (83.8 kg)  Fetal Heart Rate (bpm): 166  Uterus:     Pelvic Exam: Perineum: no hemorrhoids, normal perineum   Vulva: normal external genitalia, no lesions   Vagina:  normal mucosa mild amount of white discharge but denies any symptoms   Cervix: no lesions and normal, pap smear done.   System: General: well-developed, well-nourished female in no acute distress   Skin: normal coloration and turgor, no rashes   Neurologic: oriented, normal, negative, normal mood   Extremities: normal strength, tone, and muscle mass, ROM of all joints is normal   HEENT PERRLA, extraocular movement intact and sclera clear, anicteric   Neck supple and no masses   Respiratory:  no respiratory distress      Assessment:   Pregnancy: G1P0000 Patient Active Problem List   Diagnosis  Date Noted   Supervision of high risk pregnancy, antepartum 05/19/2021   Polycystic ovarian syndrome 12/13/2017   Chronic hypertension during pregnancy, antepartum 01/01/2017   Type 2 diabetes mellitus ([place]) 05/31/2016   Ovarian cyst 05/04/2016   Asthma 09/30/2015   BMI, pediatric > 99% for age 17/15/2017   Secondary amenorrhea 09/30/2015   Eczema 09/30/2015   [name] nigricans 09/30/2015   Hyperandrogenism 09/30/2015   Acne vulgaris 09/30/2015   Hirsutism 09/30/2015     Plan:  1. Supervision of high risk pregnancy, antepartum Initial labs drawn. Continue prenatal vitamins. Genetic Screening discussed, NIPS: ordered. Ultrasound discussed; fetal anatomic survey: ordered. Problem list reviewed and updated. The nature of Dyad/Family Care clinic was explained to patient; Voiced they may need to be seen by other [facility] providers which includes family medicine physicians, OB GYNs, and APPs. Delivery will hopefully be with one of the Dyad providers or another [facility] Medicine physician and we cannot promise this at this time.  Discussed there are [facility] staff in the hospital 24-7 and they understand and support this model and there is a likelihood one of these providers will catch their baby.  We also discussed that the service includes learners (residents, student) and they will be involved in the care team.   2. Chronic hypertension during pregnancy, antepartum No meds, normotensive today Review of chart supports this diagnosis Start baby ASA  3. Type 2 diabetes mellitus without complication, with long-term current use of insulin ([place]) Moved recently and insulin was inadvertently thrown out Meter recently broke Very interested insulin pump and CGM, will coordinate with DM educator to obtain Start baby ASA Baseline labs obtained  4. Screening for cervical cancer Pap collected today  Routine obstetric precautions reviewed. Return in about 4 weeks (around 09/01/2021) for Dyad patient, ob  visit, [place].

## 2021-08-04 NOTE — Progress Notes (Signed)
Needs new meter, previous one broke. [place]

## 2021-08-04 NOTE — Addendum Note (Signed)
Addended by: Brita Romp E on: 08/04/2021 04:10 PM   Modules accepted: Orders

## 2021-08-04 NOTE — Patient Instructions (Signed)
Second Trimester of Pregnancy The second trimester of pregnancy is from week 13 through week 27. This is months 4 through 6 of pregnancy. The second trimester is often a time when you feel your best. Your body has adjusted to being pregnant, and you begin to feel better physically. During the second trimester: Morning sickness has lessened or stopped completely. You may have more energy. You may have an increase in appetite. The second trimester is also a time when the unborn baby (fetus) is growing rapidly. At the end of the sixth month, the fetus may be up to 12 inches long and weigh about 1 pounds. You will likely begin to feel the baby move (quickening) between 16 and 20 weeks of pregnancy. Body changes during your second trimester Your body continues to go through many changes during your second trimester. The changes vary and generally return to normal after the baby is born. Physical changes Your weight will continue to increase. You will notice your lower abdomen bulging out. You may begin to get stretch marks on your hips, abdomen, and breasts. Your breasts will continue to grow and to become tender. Dark spots or blotches (chloasma or mask of pregnancy) may develop on your face. A dark line from your belly button to the pubic area (linea nigra) may appear. You may have changes in your hair. These can include thickening of your hair, rapid growth, and changes in texture. Some people also have hair loss during or after pregnancy, or hair that feels dry or thin. Health changes You may develop headaches. You may have heartburn. You may develop constipation. You may develop hemorrhoids or swollen, bulging veins (varicose veins). Your gums may bleed and may be sensitive to brushing and flossing. You may urinate more often because the fetus is pressing on your bladder. You may have back pain. This is caused by: Weight gain. Pregnancy hormones that are relaxing the joints in your  pelvis. A shift in weight and the muscles that support your balance. Follow these instructions at home: Medicines Follow your health care provider's instructions regarding medicine use. Specific medicines may be either safe or unsafe to take during pregnancy. Do not take any medicines unless approved by your health care provider. Take a prenatal vitamin that contains at least 600 micrograms (mcg) of folic acid. Eating and drinking Eat a healthy diet that includes fresh fruits and vegetables, whole grains, good sources of protein such as meat, eggs, or tofu, and low-fat dairy products. Avoid raw meat and unpasteurized juice, milk, and cheese. These carry germs that can harm you and your baby. You may need to take these actions to prevent or treat constipation: Drink enough fluid to keep your urine pale yellow. Eat foods that are high in fiber, such as beans, whole grains, and fresh fruits and vegetables. Limit foods that are high in fat and processed sugars, such as fried or sweet foods. Activity Exercise only as directed by your health care provider. Most people can continue their usual exercise routine during pregnancy. Try to exercise for 30 minutes at least 5 days a week. Stop exercising if you develop contractions in your uterus. Stop exercising if you develop pain or cramping in the lower abdomen or lower back. Avoid exercising if it is very hot or humid or if you are at a high altitude. Avoid heavy lifting. If you choose to, you may have sex unless your health care provider tells you not to. Relieving pain and discomfort Wear a supportive bra  to prevent discomfort from breast tenderness. Take warm sitz baths to soothe any pain or discomfort caused by hemorrhoids. Use hemorrhoid cream if your health care provider approves. Rest with your legs raised (elevated) if you have leg cramps or low back pain. If you develop varicose veins: Wear support hose as told by your health care  provider. Elevate your feet for 15 minutes, 3-4 times a day. Limit salt in your diet. Safety Wear your seat belt at all times when driving or riding in a car. Talk with your health care provider if someone is verbally or physically abusive to you. Lifestyle Do not use hot tubs, steam rooms, or saunas. Do not douche. Do not use tampons or scented sanitary pads. Avoid cat litter boxes and soil used by cats. These carry germs that can cause birth defects in the baby and possibly loss of the fetus by miscarriage or stillbirth. Do not use herbal remedies, alcohol, illegal drugs, or medicines that are not approved by your health care provider. Chemicals in these products can harm your baby. Do not use any products that contain nicotine or tobacco, such as cigarettes, e-cigarettes, and chewing tobacco. If you need help quitting, ask your health care provider. General instructions During a routine prenatal visit, your health care provider will do a physical exam and other tests. He or she will also discuss your overall health. Keep all follow-up visits. This is important. Ask your health care provider for a referral to a local prenatal education class. Ask for help if you have counseling or nutritional needs during pregnancy. Your health care provider can offer advice or refer you to specialists for help with various needs. Where to find more information American Pregnancy Association: americanpregnancy.[place] and Gynecologists: PoolDevices.com.pt Office on [employer] Health: KeywordPortfolios.com.br Contact a health care provider if you have: A headache that does not go away when you take medicine. Vision changes or you see spots in front of your eyes. Mild pelvic cramps, pelvic pressure, or nagging pain in the abdominal area. Persistent nausea, vomiting, or diarrhea. A bad-smelling vaginal discharge or foul-smelling urine. Pain when you  urinate. Sudden or extreme swelling of your face, hands, ankles, feet, or legs. A fever. Get help right away if you: Have fluid leaking from your vagina. Have spotting or bleeding from your vagina. Have severe abdominal cramping or pain. Have difficulty breathing. Have chest pain. Have fainting spells. Have not felt your baby move for the time period told by your health care provider. Have new or increased pain, swelling, or redness in an arm or leg. Summary The second trimester of pregnancy is from week 13 through week 27 (months 4 through 6). Do not use herbal remedies, alcohol, illegal drugs, or medicines that are not approved by your health care provider. Chemicals in these products can harm your baby. Exercise only as directed by your health care provider. Most people can continue their usual exercise routine during pregnancy. Keep all follow-up visits. This is important. This information is not intended to replace advice given to you by your health care provider. Make sure you discuss any questions you have with your health care provider. Document Revised: 01/08/2020 Document Reviewed: 11/14/2019 Elsevier Patient Education  2022 [employer].  Contraception Choices Contraception, also called birth control, refers to methods or devices that prevent pregnancy. Hormonal methods Contraceptive implant A contraceptive implant is a thin, plastic tube that contains a hormone that prevents pregnancy. It is different from an intrauterine device (IUD). It  is inserted into the upper part of the arm by a health care provider. Implants can be effective for up to 3 years. Progestin-only injections Progestin-only injections are injections of progestin, a synthetic form of the hormone progesterone. They are given every 3 months by a health care provider. Birth control pills Birth control pills are pills that contain hormones that prevent pregnancy. They must be taken once a day, preferably at the  same time each day. A prescription is needed to use this method of contraception. Birth control patch The birth control patch contains hormones that prevent pregnancy. It is placed on the skin and must be changed once a week for three weeks and removed on the fourth week. A prescription is needed to use this method of contraception. Vaginal ring A vaginal ring contains hormones that prevent pregnancy. It is placed in the vagina for three weeks and removed on the fourth week. After that, the process is repeated with a new ring. A prescription is needed to use this method of contraception. Emergency contraceptive Emergency contraceptives prevent pregnancy after unprotected sex. They come in pill form and can be taken up to 5 days after sex. They work best the sooner they are taken after having sex. Most emergency contraceptives are available without a prescription. This method should not be used as your only form of birth control. Barrier methods Female condom A female condom is a thin sheath that is worn over the penis during sex. Condoms keep sperm from going inside a woman's body. They can be used with a sperm-killing substance (spermicide) to increase their effectiveness. They should be thrown away after one use. Female condom A female condom is a soft, loose-fitting sheath that is put into the vagina before sex. The condom keeps sperm from going inside a woman's body. They should be thrown away after one use. Diaphragm A diaphragm is a soft, dome-shaped barrier. It is inserted into the vagina before sex, along with a spermicide. The diaphragm blocks sperm from entering the uterus, and the spermicide kills sperm. A diaphragm should be left in the vagina for 6-8 hours after sex and removed within 24 hours. A diaphragm is prescribed and fitted by a health care provider. A diaphragm should be replaced every 1-2 years, after giving birth, after gaining more than 15 lb (6.8 kg), and after pelvic  surgery. Cervical cap A cervical cap is a round, soft latex or plastic cup that fits over the cervix. It is inserted into the vagina before sex, along with spermicide. It blocks sperm from entering the uterus. The cap should be left in place for 6-8 hours after sex and removed within 48 hours. A cervical cap must be prescribed and fitted by a health care provider. It should be replaced every 2 years. Sponge A sponge is a soft, circular piece of polyurethane foam with spermicide in it. The sponge helps block sperm from entering the uterus, and the spermicide kills sperm. To use it, you make it wet and then insert it into the vagina. It should be inserted before sex, left in for at least 6 hours after sex, and removed and thrown away within 30 hours. Spermicides Spermicides are chemicals that kill or block sperm from entering the cervix and uterus. They can come as a cream, jelly, suppository, foam, or tablet. A spermicide should be inserted into the vagina with an applicator at least 23-53 minutes before sex to allow time for it to work. The process must be repeated every time  you have sex. Spermicides do not require a prescription. Intrauterine contraception Intrauterine device (IUD) An IUD is a T-shaped device that is put in a woman's uterus. There are two types: Hormone IUD.This type contains progestin, a synthetic form of the hormone progesterone. This type can stay in place for 3-5 years. Copper IUD.This type is wrapped in copper wire. It can stay in place for 10 years. Permanent methods of contraception Female tubal ligation In this method, a woman's fallopian tubes are sealed, tied, or blocked during surgery to prevent eggs from traveling to the uterus. Hysteroscopic sterilization In this method, a small, flexible insert is placed into each fallopian tube. The inserts cause scar tissue to form in the fallopian tubes and block them, so sperm cannot reach an egg. The procedure takes about 3  months to be effective. Another form of birth control must be used during those 3 months. Female sterilization This is a procedure to tie off the tubes that carry sperm (vasectomy). After the procedure, the man can still ejaculate fluid (semen). Another form of birth control must be used for 3 months after the procedure. Natural planning methods Natural family planning In this method, a couple does not have sex on days when the woman could become pregnant. Calendar method In this method, the woman keeps track of the length of each menstrual cycle, identifies the days when pregnancy can happen, and does not have sex on those days. Ovulation method In this method, a couple avoids sex during ovulation. Symptothermal method This method involves not having sex during ovulation. The woman typically checks for ovulation by watching changes in her temperature and in the consistency of cervical mucus. Post-ovulation method In this method, a couple waits to have sex until after ovulation. Where to find more information Centers for Disease Control and Prevention: http://www.wolf.info/ Summary Contraception, also called birth control, refers to methods or devices that prevent pregnancy. Hormonal methods of contraception include implants, injections, pills, patches, vaginal rings, and emergency contraceptives. Barrier methods of contraception can include female condoms, female condoms, diaphragms, cervical caps, sponges, and spermicides. There are two types of IUDs (intrauterine devices). An IUD can be put in a woman's uterus to prevent pregnancy for 3-5 years. Permanent sterilization can be done through a procedure for males and females. Natural family planning methods involve nothaving sex on days when the woman could become pregnant. This information is not intended to replace advice given to you by your health care provider. Make sure you discuss any questions you have with your health care provider. Document  Revised: 01/06/2020 Document Reviewed: 01/06/2020 Elsevier Patient Education  [place].

## 2021-08-05 ENCOUNTER — Telehealth: Payer: Self-pay | Admitting: Clinical

## 2021-08-05 ENCOUNTER — Telehealth: Payer: Self-pay | Admitting: General Practice

## 2021-08-05 LAB — CBC/D/PLT+RPR+RH+ABO+RUBIGG...
Antibody Screen: NEGATIVE
Basophils Absolute: 0 10*3/uL (ref 0.0–0.2)
Basos: 0 %
EOS (ABSOLUTE): 0.1 10*3/uL (ref 0.0–0.4)
Eos: 1 %
HCV Ab: <0.1 s/co ratio (ref 0.0–0.9)
HIV Screen 4th Generation wRfx: NONREACTIVE
Hematocrit: 40.2 % (ref 34.0–46.6)
Hemoglobin: 13.2 g/dL (ref 11.1–15.9)
Hepatitis B Surface Ag: NEGATIVE
Immature Grans (Abs): 0 10*3/uL (ref 0.0–0.1)
Immature Granulocytes: 0 %
Lymphocytes Absolute: 2.4 10*3/uL (ref 0.7–3.1)
Lymphs: 23 %
MCH: 25.6 pg — ABNORMAL LOW (ref 26.6–33.0)
MCHC: 32.8 g/dL (ref 31.5–35.7)
MCV: 78 fL — ABNORMAL LOW (ref 79–97)
Monocytes Absolute: 0.5 10*3/uL (ref 0.1–0.9)
Monocytes: 5 %
Neutrophils Absolute: 7.3 10*3/uL — ABNORMAL HIGH (ref 1.4–7.0)
Neutrophils: 71 %
Platelets: 492 10*3/uL — ABNORMAL HIGH (ref 150–450)
RBC: 5.15 x10E6/uL (ref 3.77–5.28)
RDW: 14.7 % (ref 11.7–15.4)
RPR Ser Ql: NONREACTIVE
Rh Factor: POSITIVE
Rubella Antibodies, IGG: 3.72 index (ref >0.99)
WBC: 10.4 10*3/uL (ref 3.4–10.8)

## 2021-08-05 LAB — COMPREHENSIVE METABOLIC PANEL
ALT: 10 IU/L (ref 0–32)
AST: 10 IU/L (ref 0–40)
Albumin/Globulin Ratio: 1.4 (ref 1.2–2.2)
Albumin: 4.1 g/dL (ref 3.9–5.0)
Alkaline Phosphatase: 87 IU/L (ref 44–121)
BUN/Creatinine Ratio: 10 (ref 9–23)
BUN: 6 mg/dL (ref 6–20)
Bilirubin Total: <0.2 mg/dL (ref 0.0–1.2)
CO2: 20 mmol/L (ref 20–29)
Calcium: 9.6 mg/dL (ref 8.7–10.2)
Chloride: 103 mmol/L (ref 96–106)
Creatinine, Ser: 0.59 mg/dL (ref 0.57–1.00)
Globulin, Total: 3 g/dL (ref 1.5–4.5)
Glucose: 198 mg/dL — ABNORMAL HIGH (ref 70–99)
Potassium: 4 mmol/L (ref 3.5–5.2)
Sodium: 134 mmol/L (ref 134–144)
Total Protein: 7.1 g/dL (ref 6.0–8.5)
eGFR: 131 mL/min/{1.73_m2} (ref >59)

## 2021-08-05 LAB — PROTEIN / CREATININE RATIO, URINE
Creatinine, Urine: 95.1 mg/dL
Protein, Ur: 7.9 mg/dL
Protein/Creat Ratio: 83 mg/g creat (ref 0–200)

## 2021-08-05 LAB — HEMOGLOBIN A1C
Est. average glucose Bld gHb Est-mCnc: 237 mg/dL
Hgb A1c MFr Bld: 9.9 % — ABNORMAL HIGH (ref 4.8–5.6)

## 2021-08-05 LAB — HCV INTERPRETATION

## 2021-08-05 NOTE — Addendum Note (Signed)
Addended by: Clayton Lefort on: 08/05/2021 09:30 AM   Modules accepted: Orders

## 2021-08-05 NOTE — Telephone Encounter (Signed)
Called patient regarding need for eye exam, no answer- left message to call us back

## 2021-08-05 NOTE — Telephone Encounter (Signed)
Attempt to schedule, per referral; Left HIPPA-compliant message to call back Roselyn Reef from [employer] for [employer] at [facility] at  [phone] [facility] office); left MyChart message for pt.

## 2021-08-10 ENCOUNTER — Other Ambulatory Visit: Payer: Self-pay

## 2021-08-10 ENCOUNTER — Ambulatory Visit: Payer: 59 | Admitting: *Deleted

## 2021-08-10 ENCOUNTER — Ambulatory Visit: Payer: 59 | Attending: Family Medicine

## 2021-08-10 ENCOUNTER — Ambulatory Visit (HOSPITAL_BASED_OUTPATIENT_CLINIC_OR_DEPARTMENT_OTHER): Payer: 59 | Admitting: Maternal & Fetal Medicine

## 2021-08-10 ENCOUNTER — Encounter: Payer: Self-pay | Admitting: Family Medicine

## 2021-08-10 ENCOUNTER — Other Ambulatory Visit: Payer: Self-pay | Admitting: *Deleted

## 2021-08-10 VITALS — BP 143/58 | HR 74

## 2021-08-10 DIAGNOSIS — O0990 Supervision of high risk pregnancy, unspecified, unspecified trimester: Secondary | ICD-10-CM | POA: Diagnosis not present

## 2021-08-10 DIAGNOSIS — E1111 Type 2 diabetes mellitus with ketoacidosis with coma: Secondary | ICD-10-CM | POA: Insufficient documentation

## 2021-08-10 DIAGNOSIS — O24112 Pre-existing diabetes mellitus, type 2, in pregnancy, second trimester: Secondary | ICD-10-CM

## 2021-08-10 DIAGNOSIS — O10919 Unspecified pre-existing hypertension complicating pregnancy, unspecified trimester: Secondary | ICD-10-CM | POA: Insufficient documentation

## 2021-08-10 DIAGNOSIS — Z3A27 27 weeks gestation of pregnancy: Secondary | ICD-10-CM | POA: Diagnosis not present

## 2021-08-10 DIAGNOSIS — O10012 Pre-existing essential hypertension complicating pregnancy, second trimester: Secondary | ICD-10-CM

## 2021-08-10 DIAGNOSIS — O10912 Unspecified pre-existing hypertension complicating pregnancy, second trimester: Secondary | ICD-10-CM

## 2021-08-10 DIAGNOSIS — R8271 Bacteriuria: Secondary | ICD-10-CM | POA: Insufficient documentation

## 2021-08-10 LAB — CYTOLOGY - PAP
Chlamydia: NEGATIVE
Comment: NEGATIVE
Comment: NORMAL
Diagnosis: NEGATIVE
Neisseria Gonorrhea: NEGATIVE

## 2021-08-10 LAB — URINE CULTURE, OB REFLEX

## 2021-08-10 LAB — CULTURE, OB URINE

## 2021-08-10 NOTE — Progress Notes (Signed)
MFM Brief Note  Sherri Lopez is a G1P0 at 78 weeks here for a detailed exam and consultation regarding CHTN and T2DM at the request of Estill Cotta, MD  Single intrauterine pregnancy here for a detailed anatomy due to type 2 diabetes and chronic hypertension not on medications. Normal anatomy with measurements consistent with dates There is good fetal movement and amniotic fluid volume Suboptimal views of the fetal anatomy were obtained secondary to fetal position.  Sherri Lopez has T2DM with an early pregnancy HgbA1c of 9.9%.  We discussed the mainstay of T2DM management to include nutrition, exercise and medical therapy to achieved a FBS 60-90 and 2hr PP < 120 mg/dL.   I reviewed today's study and recommend serial growth, fetal echocardiogram (referral to [facility] sent today) and initiation of weekly testing at 32 weeks. We discussed the increased risk for fetal macrosomia, cardiac defects, maternal and fetal birth trauma with temporary and/or permenant damage, stillbirth and neonatal ICU admission.   Secondly, we discussed Sherri Lopez' diagnosis of chronic hypertension not on medical therapy. Her blood pressure today was 142/74 and 143/58 mmHg. We discussed the increased risk for preeclampsia, placental abruption and fetal growth restriction in women with chronic hypertension. She is taking low dose ASA for preeclampsia prevention.  I explained that serial growth exams should be performed every 4 weeks throughout the pregnancy. Medical therapy should be initiated if blood pressure persist >150/100's. If medical therapy is initiated we recommend weekly testing at 32 weeks. She is aware of the s/sx or preeclampsia including headache, vision changes and right upper quadrant pain.  Baseline labs of UPC, CBC, and CMP should be performed if not performed previously.   She is scheduled for growth in 4-6 weeks  I spent 30 minutes with > 50% in face to face consultation.  Vikki Ports, MD

## 2021-08-10 NOTE — Telephone Encounter (Signed)
Pt has next OB appt on 09/01/21 and will inform pt of need of eye exam at this time. Added to pink sticky.  Colletta Maryland, RN

## 2021-08-12 ENCOUNTER — Other Ambulatory Visit: Payer: 59

## 2021-08-15 NOTE — L&D Delivery Note (Addendum)
OB/GYN Faculty Practice Delivery Note ? ?Sherri Lopez is a 21 y.o. G1P0000 s/p SVD at [redacted]w[redacted]d She was admitted for IOL d/t cHTN w/ SIPE (without SF) ? ?ROM: 15h 350mith clear fluid ?GBS Status: Positive treated with clindamycin (PCN allergy) ?Maximum Maternal Temperature: 99.2 ? ?Labor Progress: ?Patient arrived 2.5 cm dilated, received cytotec x1, had a foley balloon and once that was out she was AROMed. She also received pitocin and ultimately progressed to complete. ? ?Delivery Date/Time: 12/25/2021 at 10:41 ?Delivery: Called to room and patient was complete and pushing. Head delivered ROA. No nuchal cord present. Shoulder and body delivered in usual fashion. Infant with spontaneous cry, placed on mother's abdomen, dried and stimulated. Cord clamped x 2 after 1-minute delay, and cut by FOB under my direct supervision. Cord blood drawn. Placenta delivered spontaneously with gentle cord traction. Fundus firm with massage and Pitocin. Labia, perineum, vagina, and cervix were inspected, right periurethral laceration repaired with 4-0 monocryl and left labial abrasion repaired with 3-0 vicryl .  ? ?Placenta: complete, three vessel cord, to L&D ?Complications: None ?Lacerations:  right periurethral laceration and left labial ?EBL: 50 mL ?Analgesia: Epidural ? ? ?Infant: Girl  APGARs 8 and 9   Weight is pending ? ?JoAlen BleacherMD ?[facility]? ? ? ? GME ATTESTATION:  ?I saw and evaluated the patient. I agree with the findings and the plan of care as documented in the resident?s note. I was gloved, gowned and present for entire delivery and repair. ? ?AnRenard MatterMD, MPH ?OB Fellow, Faculty Practice ?[facility] ?12/25/2021 3:22 PM ? ?

## 2021-08-20 ENCOUNTER — Telehealth: Payer: Self-pay | Admitting: Lactation Services

## 2021-08-20 NOTE — Telephone Encounter (Signed)
Called patient with results of [employer] Carrier Screening showing she is a Engineer, production Thalassemia.   Patient informed of results and recommendation to call [facility] at [phone] to set up a telephone Genetic Counseling session.   Patient was advised that it is recommended that FOB also be tested. Reviewed we have saliva kits in the office that she can pick up at her next appointment to take home to be completed and mailed back.   Patient asked about needing Penicillin for GBS in urine. Reviewed lab results. Patient is GBS positive and need ATB during labor. Patient is allergic to PCN, reviewed she will be given an alternative.   Patient also asked to be rescheduled for her Diabetes Ed appt that she missed, appointment made and appointment date and time given to patient.

## 2021-08-23 ENCOUNTER — Encounter: Payer: Self-pay | Admitting: Family Medicine

## 2021-08-23 DIAGNOSIS — D563 Thalassemia minor: Secondary | ICD-10-CM | POA: Insufficient documentation

## 2021-09-02 ENCOUNTER — Other Ambulatory Visit: Payer: 59 | Admitting: Registered"

## 2021-09-07 ENCOUNTER — Other Ambulatory Visit: Payer: Self-pay

## 2021-09-07 ENCOUNTER — Encounter: Payer: Self-pay | Admitting: *Deleted

## 2021-09-07 ENCOUNTER — Other Ambulatory Visit: Payer: Self-pay | Admitting: Obstetrics and Gynecology

## 2021-09-07 ENCOUNTER — Ambulatory Visit: Payer: 59 | Attending: Maternal & Fetal Medicine

## 2021-09-07 ENCOUNTER — Ambulatory Visit: Payer: 59 | Admitting: *Deleted

## 2021-09-07 VITALS — BP 127/56 | HR 77

## 2021-09-07 DIAGNOSIS — Z3A21 21 weeks gestation of pregnancy: Secondary | ICD-10-CM | POA: Diagnosis not present

## 2021-09-07 DIAGNOSIS — O10012 Pre-existing essential hypertension complicating pregnancy, second trimester: Secondary | ICD-10-CM | POA: Diagnosis not present

## 2021-09-07 DIAGNOSIS — O0990 Supervision of high risk pregnancy, unspecified, unspecified trimester: Secondary | ICD-10-CM

## 2021-09-07 DIAGNOSIS — O10912 Unspecified pre-existing hypertension complicating pregnancy, second trimester: Secondary | ICD-10-CM | POA: Diagnosis present

## 2021-09-07 DIAGNOSIS — O10919 Unspecified pre-existing hypertension complicating pregnancy, unspecified trimester: Secondary | ICD-10-CM

## 2021-09-07 DIAGNOSIS — O24119 Pre-existing diabetes mellitus, type 2, in pregnancy, unspecified trimester: Secondary | ICD-10-CM

## 2021-09-07 DIAGNOSIS — O24112 Pre-existing diabetes mellitus, type 2, in pregnancy, second trimester: Secondary | ICD-10-CM | POA: Diagnosis present

## 2021-09-16 ENCOUNTER — Other Ambulatory Visit: Payer: Self-pay | Admitting: Family Medicine

## 2021-09-16 ENCOUNTER — Other Ambulatory Visit: Payer: Self-pay

## 2021-09-16 ENCOUNTER — Ambulatory Visit: Payer: 59 | Admitting: Registered"

## 2021-09-16 ENCOUNTER — Encounter: Payer: 59 | Attending: Family Medicine | Admitting: Registered"

## 2021-09-16 DIAGNOSIS — Z794 Long term (current) use of insulin: Secondary | ICD-10-CM

## 2021-09-16 DIAGNOSIS — O24419 Gestational diabetes mellitus in pregnancy, unspecified control: Secondary | ICD-10-CM | POA: Diagnosis present

## 2021-09-16 DIAGNOSIS — E1111 Type 2 diabetes mellitus with ketoacidosis with coma: Secondary | ICD-10-CM

## 2021-09-16 DIAGNOSIS — O24119 Pre-existing diabetes mellitus, type 2, in pregnancy, unspecified trimester: Secondary | ICD-10-CM

## 2021-09-16 MED ORDER — CONTOUR NEXT TEST VI STRP: ORAL_STRIP | 12 refills | Status: DC

## 2021-09-16 MED ORDER — INSULIN NPH (HUMAN) (ISOPHANE) 100 UNIT/ML ~~LOC~~ SUSP: 14.0000 [IU] | Freq: Two times a day (BID) | SUBCUTANEOUS | 11 refills | Status: DC

## 2021-09-16 MED ORDER — "INSULIN SYRINGE/NEEDLE 27G X 1/2"" 0.5 ML MISC": 1.0000 | 11 refills | Status: DC | PRN

## 2021-09-16 MED ORDER — INSULIN ASPART 100 UNIT/ML FLEXPEN: 18.0000 [IU] | PEN_INJECTOR | Freq: Three times a day (TID) | SUBCUTANEOUS | 11 refills | Status: DC

## 2021-09-16 MED ORDER — BD PEN NEEDLE NANO U/F 32G X 4 MM MISC: 3 refills | Status: DC

## 2021-09-16 NOTE — Patient Instructions (Addendum)
We can start the process to get you started on using the Omnipod DASH insulin pump and continuous glucose monitor (Dexcom)  Please do the follow preparation steps  Set up Clarity app in preparation for Dexcom G7 (probably available in March). Continue checking blood sugar with your glucometer.  Dexcom: Please plan on a 1 hour visit to start the Dexcom after you receive the supplies.   Ominipod DASH: We will place order for Omnipod DASH pods and Humalog insulin vials to be sent to your pharmacy. When you pick up order verity that it is DASH pods We will place order for Omnipod DASH starter kit to [facility]. They will contact you phone or email to verify your address.  Let me know within a week if you have heard anything from either pharmacy.

## 2021-09-16 NOTE — Progress Notes (Addendum)
Patient was seen on 09/16/21 for Type 2 Diabetes in Pregnancy Self-management. EDD 01/14/22; [redacted]w[redacted]d  Start time 0930    End time 1Frederickteam managing diabetes: [facility] of Diabetes Education: RD 3 yrs ago at Pediatric Endo clinic  Date of T2DM diagnosis: 2016 A1c: 9.9% on 08/04/21 (9.9% -12.8% x4 yrs)  Current medication:  Metformin 750 bid    Pt has not had insulin for ~3 weeks. It was thrown out when moved out of her parents home. Humalog 10-15 units tid based on carb intake Tresiba 48 units daily  SMBG: Patient using Contour Next, Rx placed for more strips  Patient wants to use this visit to discuss CGM & Insulin pump.  We can plan to start Omnipod DASH but may take a month to get all supplies and training completed.  Supplements: prenatal, Vit D 50,000  Education Topics covered: CGM options and apps needed to use Omnipod DASH vs Omnipod 5 (not approved for pregnancy?)  Plan:  We can start the process to get you started on using the Omnipod DASH insulin pump and continuous glucose monitor (Dexcom)  Please do the follow preparation steps for CGM: Set up Clarity app in preparation for Dexcom G7 (probably available in March). Continue checking blood sugar with your glucometer.  Dexcom: Please plan on a 1 hour visit to start the Dexcom after you receive the supplies.   Ominipod DASH: We will place order for Omnipod DASH pods and Humalog insulin vials to be sent to your pharmacy. When you pick up order verity that it is DASH pods We will place order for Omnipod DASH starter kit [place] They will contact you phone or email to verify your address. Let me know within a week if you have heard anything from either pharmacy.   Patient instructed to monitor glucose levels: FBS: 70 - 95 mg/dl 2 hour: <120 mg/dl  Patient received the following handouts: Omnipod start list [place] patient bill of rights  Patient will be seen for  follow-up in as soon as patient receives supplies for CGM and/or Omnipod DASH

## 2021-09-16 NOTE — Progress Notes (Signed)
New insulin orders until insulin pump can be obtained

## 2021-09-17 ENCOUNTER — Other Ambulatory Visit: Payer: Self-pay | Admitting: Lactation Services

## 2021-09-17 MED ORDER — OMNIPOD DASH PODS (GEN 4) MISC: 1.0000 | 6 refills | Status: DC

## 2021-09-17 MED ORDER — OMNIPOD DASH INTRO (GEN 4) KIT
1.0000 | PACK | 0 refills | Status: DC
Start: 2021-09-17 — End: 2021-09-17

## 2021-09-17 MED ORDER — OMNIPOD DASH INTRO (GEN 4) KIT
1.0000 | PACK | 0 refills | Status: DC
Start: 2021-09-17 — End: 2021-09-30

## 2021-09-17 NOTE — Progress Notes (Signed)
Omnipod dash and intro kit ordered per Levie Heritage, RD

## 2021-09-20 ENCOUNTER — Other Ambulatory Visit: Payer: Self-pay | Admitting: Lactation Services

## 2021-09-20 MED ORDER — INSULIN LISPRO (1 UNIT DIAL) 100 UNIT/ML (KWIKPEN): 18.0000 [IU] | PEN_INJECTOR | Freq: Three times a day (TID) | SUBCUTANEOUS | 11 refills | Status: DC

## 2021-09-20 NOTE — Progress Notes (Signed)
Reordered Humalog Quick pens due to insurance coverage.

## 2021-09-22 ENCOUNTER — Other Ambulatory Visit: Payer: Self-pay

## 2021-09-30 ENCOUNTER — Other Ambulatory Visit: Payer: Self-pay

## 2021-09-30 DIAGNOSIS — O0990 Supervision of high risk pregnancy, unspecified, unspecified trimester: Secondary | ICD-10-CM

## 2021-09-30 DIAGNOSIS — E119 Type 2 diabetes mellitus without complications: Secondary | ICD-10-CM

## 2021-09-30 MED ORDER — OMNIPOD 5 DEXG7G6 INTRO GEN 5 KIT: 1.0000 | PACK | Freq: Once | 0 refills | Status: AC

## 2021-09-30 MED ORDER — OMNIPOD 5 DEXG7G6 PODS GEN 5 MISC: 1.0000 | 1 refills | Status: DC

## 2021-10-05 ENCOUNTER — Ambulatory Visit: Payer: 59 | Admitting: *Deleted

## 2021-10-05 ENCOUNTER — Other Ambulatory Visit: Payer: Self-pay | Admitting: *Deleted

## 2021-10-05 ENCOUNTER — Encounter: Payer: Self-pay | Admitting: *Deleted

## 2021-10-05 ENCOUNTER — Ambulatory Visit: Payer: 59 | Attending: Obstetrics and Gynecology

## 2021-10-05 ENCOUNTER — Other Ambulatory Visit: Payer: Self-pay

## 2021-10-05 VITALS — BP 136/72 | HR 83

## 2021-10-05 DIAGNOSIS — O24912 Unspecified diabetes mellitus in pregnancy, second trimester: Secondary | ICD-10-CM

## 2021-10-05 DIAGNOSIS — Z794 Long term (current) use of insulin: Secondary | ICD-10-CM | POA: Insufficient documentation

## 2021-10-05 DIAGNOSIS — O24119 Pre-existing diabetes mellitus, type 2, in pregnancy, unspecified trimester: Secondary | ICD-10-CM | POA: Insufficient documentation

## 2021-10-05 DIAGNOSIS — E119 Type 2 diabetes mellitus without complications: Secondary | ICD-10-CM

## 2021-10-05 DIAGNOSIS — O10012 Pre-existing essential hypertension complicating pregnancy, second trimester: Secondary | ICD-10-CM | POA: Diagnosis not present

## 2021-10-05 DIAGNOSIS — O0990 Supervision of high risk pregnancy, unspecified, unspecified trimester: Secondary | ICD-10-CM | POA: Diagnosis present

## 2021-10-05 DIAGNOSIS — O10919 Unspecified pre-existing hypertension complicating pregnancy, unspecified trimester: Secondary | ICD-10-CM | POA: Diagnosis not present

## 2021-10-05 DIAGNOSIS — Z3A25 25 weeks gestation of pregnancy: Secondary | ICD-10-CM | POA: Diagnosis not present

## 2021-10-05 DIAGNOSIS — O10912 Unspecified pre-existing hypertension complicating pregnancy, second trimester: Secondary | ICD-10-CM

## 2021-10-05 DIAGNOSIS — O24112 Pre-existing diabetes mellitus, type 2, in pregnancy, second trimester: Secondary | ICD-10-CM | POA: Diagnosis not present

## 2021-10-07 ENCOUNTER — Other Ambulatory Visit: Payer: Self-pay

## 2021-10-07 MED ORDER — DEXCOM G7 RECEIVER DEVI: 1.0000 | 0 refills | Status: DC | PRN

## 2021-10-07 MED ORDER — DEXCOM G7 SENSOR MISC: 1.0000 | 0 refills | Status: DC | PRN

## 2021-10-14 ENCOUNTER — Other Ambulatory Visit: Payer: Self-pay | Admitting: Lactation Services

## 2021-10-14 DIAGNOSIS — O24119 Pre-existing diabetes mellitus, type 2, in pregnancy, unspecified trimester: Secondary | ICD-10-CM

## 2021-10-14 MED ORDER — INSULIN LISPRO 100 UNIT/ML IJ SOLN: INTRAMUSCULAR | 6 refills | Status: DC

## 2021-10-14 NOTE — Progress Notes (Signed)
Ordered Insulin of use in Omnipod, starting at 63 units per day per Levie Heritage, RD ?

## 2021-10-15 MED ORDER — ACCU-CHEK FASTCLIX LANCETS MISC: 1.0000 | 3 refills | Status: DC

## 2021-10-15 MED ORDER — CONTOUR NEXT TEST VI STRP: ORAL_STRIP | 12 refills | Status: DC

## 2021-10-18 ENCOUNTER — Other Ambulatory Visit: Payer: Self-pay

## 2021-10-18 MED ORDER — DEXCOM G6 RECEIVER DEVI: 1.0000 | 0 refills | Status: DC | PRN

## 2021-10-18 MED ORDER — DEXCOM G6 TRANSMITTER MISC: 1.0000 | 0 refills | Status: DC | PRN

## 2021-10-18 MED ORDER — DEXCOM G6 SENSOR MISC: 1.0000 | 0 refills | Status: DC | PRN

## 2021-10-18 NOTE — Progress Notes (Signed)
Changed Dexcom G7 to G6 per Levada Dy, DM educator.  ?

## 2021-10-20 ENCOUNTER — Telehealth: Payer: Self-pay

## 2021-10-20 NOTE — Telephone Encounter (Signed)
Called pt with update on approved PA for Dexcom G6. Spoke with pt. Pt aware and will pick up supplies today.  ?[employer] on file to make aware of PA approval.  ?Lashena Signer,RN  ? ?

## 2021-10-25 ENCOUNTER — Telehealth (INDEPENDENT_AMBULATORY_CARE_PROVIDER_SITE_OTHER): Payer: 59 | Admitting: Family Medicine

## 2021-10-25 ENCOUNTER — Other Ambulatory Visit: Payer: Self-pay

## 2021-10-25 DIAGNOSIS — O10919 Unspecified pre-existing hypertension complicating pregnancy, unspecified trimester: Secondary | ICD-10-CM

## 2021-10-25 DIAGNOSIS — O0990 Supervision of high risk pregnancy, unspecified, unspecified trimester: Secondary | ICD-10-CM

## 2021-10-25 DIAGNOSIS — O99820 Streptococcus B carrier state complicating pregnancy: Secondary | ICD-10-CM

## 2021-10-25 DIAGNOSIS — O24919 Unspecified diabetes mellitus in pregnancy, unspecified trimester: Secondary | ICD-10-CM

## 2021-10-25 DIAGNOSIS — O10913 Unspecified pre-existing hypertension complicating pregnancy, third trimester: Secondary | ICD-10-CM

## 2021-10-25 DIAGNOSIS — O352XX0 Maternal care for (suspected) hereditary disease in fetus, not applicable or unspecified: Secondary | ICD-10-CM

## 2021-10-25 DIAGNOSIS — O24913 Unspecified diabetes mellitus in pregnancy, third trimester: Secondary | ICD-10-CM

## 2021-10-25 DIAGNOSIS — E282 Polycystic ovarian syndrome: Secondary | ICD-10-CM

## 2021-10-25 DIAGNOSIS — Z3A28 28 weeks gestation of pregnancy: Secondary | ICD-10-CM

## 2021-10-25 DIAGNOSIS — O3483 Maternal care for other abnormalities of pelvic organs, third trimester: Secondary | ICD-10-CM

## 2021-10-25 DIAGNOSIS — D563 Thalassemia minor: Secondary | ICD-10-CM

## 2021-10-25 DIAGNOSIS — O0993 Supervision of high risk pregnancy, unspecified, third trimester: Secondary | ICD-10-CM

## 2021-10-25 DIAGNOSIS — R8271 Bacteriuria: Secondary | ICD-10-CM

## 2021-10-25 NOTE — Progress Notes (Signed)
? ? ?TELEHEALTH OBSTETRICS VISIT ENCOUNTER NOTE ? ?Provider location: Center for [employer] [facility] [employer] [facility]  ? ?Patient location: Home ? ?I connected with Sherri Lopez on 10/25/21 at  8:35 AM EDT by telephone at home and verified that I am speaking with the correct person using two identifiers. Of note, unable to do video encounter due to technical difficulties.  ?  ?I discussed the limitations, risks, security and privacy concerns of performing an evaluation and management service by telephone and the availability of in person appointments. I also discussed with the patient that there may be a patient responsible charge related to this service. The patient expressed understanding and agreed to proceed. ? ?Subjective:  ?Sherri Lopez is a 21 y.o. G1P0000 at 56w3dbeing followed for ongoing prenatal care.  She is currently monitored for the following issues for this high-risk pregnancy and has Asthma; BMI, pediatric > 99% for age; Secondary amenorrhea; Eczema; [name] nigricans; Hyperandrogenism; Acne vulgaris; Hirsutism; Ovarian cyst; Type 2 diabetes mellitus ([place]; Supervision of high risk pregnancy, antepartum; Polycystic ovarian syndrome; Chronic hypertension during pregnancy, antepartum; GBS bacteriuria; and Alpha thalassemia silent carrier on their problem list. ? ?Patient reports  ?- Started a new job-housekeeping at hotel ?- T2DM ? Fastings---highest 160, lowest 100-120. Trying to keeping it at 120 ? After eating--- 130-160, trying to keep at 120 ? ?- pressure with bending ? ?Reports fetal movement. Denies any contractions, bleeding or leaking of fluid.  ? ?The following portions of the patient's history were reviewed and updated as appropriate: allergies, current medications, past family history, past medical history, past social history, past surgical history and problem list.  ? ?Objective:  ?Last menstrual period 03/27/2021. ?General:  Alert, oriented and cooperative.   ?Mental  Status: Normal mood and affect perceived. Normal judgment and thought content.  ?Rest of physical exam deferred due to type of encounter ? ?Assessment and Plan:  ?Pregnancy: G1P0000 at 250w3d1. Supervision of high risk pregnancy, antepartum ?Patient changed to virtual visit today and was unable to connect consistently with video.  ? ?2. GBS bacteriuria ?PCN in labor ? ?3. Alpha thalassemia silent carrier ? ?4. Chronic hypertension during pregnancy, antepartum ?No BP cuff at home ?[place] Has private insurance.  ? ?5. Polycystic ovarian syndrome ? ?6. Diabetes mellitus affecting pregnancy, antepartum ?Omnipod training planned- Wednesday 3/15  ?Plan to meet with AnLevada Dyor Dexacom ?BG are not at goal and she may need further insulin management. Awaiting omnipod to help  ?Needs weekly BPP starting at 32 weeks ?IOL at 39 weeks or earlier pending glucose control ? ?Preterm labor symptoms and general obstetric precautions including but not limited to vaginal bleeding, contractions, leaking of fluid and fetal movement were reviewed in detail with the patient.  ?I discussed the assessment and treatment plan with the patient. The patient was provided an opportunity to ask questions and all were answered. The patient agreed with the plan and demonstrated an understanding of the instructions. The patient was advised to call back or seek an in-person office evaluation/[facility] any [facility] or concerning symptoms. ?Please refer to After Visit Summary for other counseling recommendations.  ? ?I provided 15 minutes of non-face-to-face time during this encounter. ? ?Return in about 2 weeks (around 11/08/2021) for Routine prenatal care, Mom+Baby Combined Care. ? ?Future Appointments  ?Date Time Provider [place]?11/01/2021  9:35 AM MOMBABYDYAD WMC-MBD WMC  ?11/03/2021  9:00 AM WMC-MFC NURSE  WMC-MFC [place]  ?11/03/2021  9:15 AM WMC-MFC US2 WMC-MFCUS WMC   ?11/15/2021 10:55 AM Caren Macadam, MD [facility]  ?12/03/2021  9:35 AM Dione Plover, Annice Needy, MD [facility]  ?12/14/2021  3:55 PM Dione Plover, Annice Needy, MD [facility]  ?12/23/2021  3:15 PM Caren Macadam, MD [facility]  ?12/30/2021  9:35 AM Caren Macadam, MD [facility]  ?01/07/2022  9:35 AM Dione Plover, Annice Needy, MD [facility]  ? ? ?Caren Macadam, MD ?Center for [place], [facility] ? ?  ?

## 2021-11-03 ENCOUNTER — Ambulatory Visit: Payer: 59 | Admitting: *Deleted

## 2021-11-03 ENCOUNTER — Other Ambulatory Visit: Payer: Self-pay | Admitting: *Deleted

## 2021-11-03 ENCOUNTER — Encounter: Payer: Self-pay | Admitting: *Deleted

## 2021-11-03 ENCOUNTER — Other Ambulatory Visit: Payer: Self-pay

## 2021-11-03 ENCOUNTER — Ambulatory Visit: Payer: 59 | Attending: Obstetrics

## 2021-11-03 VITALS — BP 124/82 | HR 75

## 2021-11-03 DIAGNOSIS — Z3A29 29 weeks gestation of pregnancy: Secondary | ICD-10-CM

## 2021-11-03 DIAGNOSIS — O24113 Pre-existing diabetes mellitus, type 2, in pregnancy, third trimester: Secondary | ICD-10-CM | POA: Diagnosis not present

## 2021-11-03 DIAGNOSIS — O10013 Pre-existing essential hypertension complicating pregnancy, third trimester: Secondary | ICD-10-CM | POA: Diagnosis not present

## 2021-11-03 DIAGNOSIS — O285 Abnormal chromosomal and genetic finding on antenatal screening of mother: Secondary | ICD-10-CM | POA: Diagnosis not present

## 2021-11-03 DIAGNOSIS — O0990 Supervision of high risk pregnancy, unspecified, unspecified trimester: Secondary | ICD-10-CM

## 2021-11-03 DIAGNOSIS — D563 Thalassemia minor: Secondary | ICD-10-CM

## 2021-11-03 DIAGNOSIS — O10913 Unspecified pre-existing hypertension complicating pregnancy, third trimester: Secondary | ICD-10-CM

## 2021-11-03 DIAGNOSIS — O24912 Unspecified diabetes mellitus in pregnancy, second trimester: Secondary | ICD-10-CM | POA: Diagnosis present

## 2021-11-03 DIAGNOSIS — O99213 Obesity complicating pregnancy, third trimester: Secondary | ICD-10-CM | POA: Diagnosis not present

## 2021-11-03 DIAGNOSIS — O10912 Unspecified pre-existing hypertension complicating pregnancy, second trimester: Secondary | ICD-10-CM | POA: Diagnosis present

## 2021-11-15 ENCOUNTER — Encounter: Payer: Self-pay | Admitting: Family Medicine

## 2021-11-15 ENCOUNTER — Ambulatory Visit (INDEPENDENT_AMBULATORY_CARE_PROVIDER_SITE_OTHER): Payer: 59 | Admitting: Family Medicine

## 2021-11-15 VITALS — BP 122/83 | HR 96 | Wt 190.0 lb

## 2021-11-15 DIAGNOSIS — O0990 Supervision of high risk pregnancy, unspecified, unspecified trimester: Secondary | ICD-10-CM

## 2021-11-15 DIAGNOSIS — O24913 Unspecified diabetes mellitus in pregnancy, third trimester: Secondary | ICD-10-CM

## 2021-11-15 NOTE — Patient Instructions (Signed)
[place] has many resources to help you plan for labor and support your pregnancy and parenting.  ? ?You are able to sign up for these courses through the website. Courses are offered evenings and weekends to increase your ability to attend. They are designed to low or no cost.  ? ?Here is the website: ?[email or web address]  ? ? ?

## 2021-11-15 NOTE — Progress Notes (Signed)
? ? ?  PRENATAL VISIT NOTE ? ?Subjective:  ?Sherri Lopez is a 21 y.o. G1P0000 at 36w3dbeing seen today for ongoing prenatal care.  She is currently monitored for the following issues for this high-risk pregnancy and has Asthma; BMI, pediatric > 99% for age; Secondary amenorrhea; Eczema; [name] nigricans; Hyperandrogenism; Acne vulgaris; Hirsutism; Ovarian cyst; Type 2 diabetes mellitus ([place]; Supervision of high risk pregnancy, antepartum; Polycystic ovarian syndrome; Chronic hypertension during pregnancy, antepartum; GBS bacteriuria; and Alpha thalassemia silent carrier on their problem list. ? ?Patient reports  did not bring log, has not picked up Dexcom .  Reports BG was 143 this AM. Contractions: Not present. Vag. Bleeding: None.  Movement: Present. Denies leaking of fluid.  ? ?The following portions of the patient's history were reviewed and updated as appropriate: allergies, current medications, past family history, past medical history, past social history, past surgical history and problem list.  ? ?Objective:  ? ?Vitals:  ? 11/15/21 1104  ?BP: 122/83  ?Pulse: 96  ?Weight: 190 lb (86.2 kg)  ? ? ?Fetal Status: Fetal Heart Rate (bpm): 164   Movement: Present    ? ?General:  Alert, oriented and cooperative. Patient is in no acute distress.  ?Skin: Skin is warm and dry. No rash noted.   ?Cardiovascular: Normal heart rate noted  ?Respiratory: Normal respiratory effort, no problems with respiration noted  ?Abdomen: Soft, gravid, appropriate for gestational age.  Pain/Pressure: Present     ?Pelvic: Cervical exam deferred        ?Extremities: Normal range of motion.     ?Mental Status: Normal mood and affect. Normal behavior. Normal judgment and thought content.  ? ?Assessment and Plan:  ?Pregnancy: G1P0000 at 327w3d1. Supervision of high risk pregnancy, antepartum ?Recommend Childbirth Education today ?- Hemoglobin A1c ? ?2. Diabetes mellitus affecting pregnancy in third trimester ?Reports fasting are  sometimes high, post prandial are usually below 140. She is working with DM education.  ?Plans on picking up dexcom  ?Knows about q4 week growth US[place] MFM ?Discussed delivery 37-39 weeks pending glucose control.  ?Starts antenatal testing next week ?Reviewed getting HA1C to see improved value with controlled sugars ?- Hemoglobin A1c ([facility]? ? ?Preterm labor symptoms and general obstetric precautions including but not limited to vaginal bleeding, contractions, leaking of fluid and fetal movement were reviewed in detail with the patient. ?Please refer to After Visit Summary for other counseling recommendations.  ? ?Return in about 1 week (around 11/22/2021) for Mom+Baby Combined Care, scheduled visit. ? ?Future Appointments  ?Date Time Provider [place]?11/24/2021 10:15 AM WMC-WOCA NST WMC-CWH WMC  ?12/02/2021 10:30 AM WMC-MFC NURSE WMC-MFC WMC  ?12/02/2021 10:45 AM WMC-MFC US5 WMC-MFCUS WMC  ?12/03/2021  9:35 AM EcDione PloverMaAnnice NeedyMD [facility]?12/08/2021 10:15 AM WMC-WOCA NST WMC-CWH WMC  ?12/14/2021  3:55 PM EcDione PloverMaAnnice NeedyMD [facility]?12/16/2021  1:15 PM WMC-WOCA NST WMC-CWH WMC  ?12/23/2021  2:15 PM WMC-WOCA NST WMC-CWH WMC  ?12/23/2021  3:15 PM NeCaren MacadamMD [facility]?12/30/2021  9:35 AM NeCaren MacadamMD WM[facility] P[facility]MG[facility]?01/07/2022  9:35 AM EcDione PloverMaAnnice NeedyMD WMRiver [facility] Surgery CenterMBay[facility]? ? ?KiCaren MacadamMD ?

## 2021-11-16 LAB — HEMOGLOBIN A1C
Est. average glucose Bld gHb Est-mCnc: 189 mg/dL
Hgb A1c MFr Bld: 8.2 % — ABNORMAL HIGH (ref 4.8–5.6)

## 2021-11-24 ENCOUNTER — Ambulatory Visit (INDEPENDENT_AMBULATORY_CARE_PROVIDER_SITE_OTHER): Payer: 59

## 2021-11-24 ENCOUNTER — Ambulatory Visit: Payer: 59 | Admitting: *Deleted

## 2021-11-24 VITALS — BP 138/84 | HR 87 | Wt 186.0 lb

## 2021-11-24 DIAGNOSIS — O24119 Pre-existing diabetes mellitus, type 2, in pregnancy, unspecified trimester: Secondary | ICD-10-CM

## 2021-11-24 DIAGNOSIS — Z3A32 32 weeks gestation of pregnancy: Secondary | ICD-10-CM

## 2021-11-24 NOTE — Progress Notes (Signed)

## 2021-11-30 ENCOUNTER — Encounter: Payer: Self-pay | Admitting: *Deleted

## 2021-12-02 ENCOUNTER — Ambulatory Visit: Payer: 59 | Attending: Obstetrics and Gynecology

## 2021-12-02 ENCOUNTER — Ambulatory Visit: Payer: 59 | Admitting: *Deleted

## 2021-12-02 ENCOUNTER — Encounter: Payer: Self-pay | Admitting: *Deleted

## 2021-12-02 ENCOUNTER — Other Ambulatory Visit: Payer: Self-pay | Admitting: *Deleted

## 2021-12-02 VITALS — BP 138/74 | HR 85

## 2021-12-02 DIAGNOSIS — O99213 Obesity complicating pregnancy, third trimester: Secondary | ICD-10-CM | POA: Diagnosis not present

## 2021-12-02 DIAGNOSIS — D563 Thalassemia minor: Secondary | ICD-10-CM

## 2021-12-02 DIAGNOSIS — O10913 Unspecified pre-existing hypertension complicating pregnancy, third trimester: Secondary | ICD-10-CM | POA: Diagnosis present

## 2021-12-02 DIAGNOSIS — O24113 Pre-existing diabetes mellitus, type 2, in pregnancy, third trimester: Secondary | ICD-10-CM | POA: Diagnosis present

## 2021-12-02 DIAGNOSIS — O10013 Pre-existing essential hypertension complicating pregnancy, third trimester: Secondary | ICD-10-CM | POA: Diagnosis not present

## 2021-12-02 DIAGNOSIS — E119 Type 2 diabetes mellitus without complications: Secondary | ICD-10-CM

## 2021-12-02 DIAGNOSIS — O0990 Supervision of high risk pregnancy, unspecified, unspecified trimester: Secondary | ICD-10-CM | POA: Diagnosis present

## 2021-12-02 DIAGNOSIS — Z3A33 33 weeks gestation of pregnancy: Secondary | ICD-10-CM

## 2021-12-02 DIAGNOSIS — O285 Abnormal chromosomal and genetic finding on antenatal screening of mother: Secondary | ICD-10-CM

## 2021-12-02 DIAGNOSIS — E669 Obesity, unspecified: Secondary | ICD-10-CM

## 2021-12-03 ENCOUNTER — Encounter: Payer: 59 | Admitting: Family Medicine

## 2021-12-08 ENCOUNTER — Other Ambulatory Visit: Payer: 59

## 2021-12-08 ENCOUNTER — Encounter: Payer: Self-pay | Admitting: *Deleted

## 2021-12-09 ENCOUNTER — Ambulatory Visit: Payer: 59 | Admitting: *Deleted

## 2021-12-09 ENCOUNTER — Ambulatory Visit (INDEPENDENT_AMBULATORY_CARE_PROVIDER_SITE_OTHER): Payer: 59

## 2021-12-09 VITALS — BP 130/73 | HR 90 | Wt 192.8 lb

## 2021-12-09 DIAGNOSIS — O24119 Pre-existing diabetes mellitus, type 2, in pregnancy, unspecified trimester: Secondary | ICD-10-CM | POA: Diagnosis not present

## 2021-12-09 NOTE — Progress Notes (Signed)

## 2021-12-13 ENCOUNTER — Encounter: Payer: Self-pay | Admitting: General Practice

## 2021-12-13 ENCOUNTER — Telehealth: Payer: Self-pay | Admitting: Registered"

## 2021-12-13 DIAGNOSIS — E119 Type 2 diabetes mellitus without complications: Secondary | ICD-10-CM

## 2021-12-13 DIAGNOSIS — O0990 Supervision of high risk pregnancy, unspecified, unspecified trimester: Secondary | ICD-10-CM

## 2021-12-13 MED ORDER — GLUCOSE BLOOD VI STRP: ORAL_STRIP | 12 refills | Status: DC

## 2021-12-13 MED ORDER — OMNIPOD 5 DEXG7G6 PODS GEN 5 MISC: 1.0000 | 2 refills | Status: AC

## 2021-12-13 MED ORDER — FREESTYLE LIBRE READER DEVI: 1.0000 | Freq: Once | 0 refills | Status: AC

## 2021-12-13 MED ORDER — FREESTYLE LIBRE 3 SENSOR MISC: 1.0000 | 1 refills | Status: DC

## 2021-12-13 NOTE — Telephone Encounter (Signed)
Patient states she is interested in using the [employer] since the co-pay for the Dexcom was too expensive. Pt was advised to see if her phone is compatible to use as the receiver and if not that would need to be ordered in addition to the sensors. Pt was advised to call insurance company to see if they cover the [employer] 2. Pt states she will let doctor know tomorrow if she wants it ordered. ? ?Pt asked about getting more pods. She was advised to go to pharmacy to see if she can get refill before her MD appt tomorrow. Also to make sure she has enough refills to get through pregnancy, end date for Rx is 12/29/21 ?

## 2021-12-13 NOTE — Telephone Encounter (Signed)
Sent Rx per Dr Dione Plover to patients pharmacy.  ?Colletta Maryland, RN  ?

## 2021-12-14 ENCOUNTER — Other Ambulatory Visit (HOSPITAL_COMMUNITY)
Admission: RE | Admit: 2021-12-14 | Discharge: 2021-12-14 | Disposition: A | Discharge status: Home / Self Care | Payer: 59 | Source: Ambulatory Visit | Attending: Family Medicine | Admitting: Family Medicine

## 2021-12-14 ENCOUNTER — Ambulatory Visit (INDEPENDENT_AMBULATORY_CARE_PROVIDER_SITE_OTHER): Payer: 59

## 2021-12-14 ENCOUNTER — Ambulatory Visit (INDEPENDENT_AMBULATORY_CARE_PROVIDER_SITE_OTHER): Payer: 59 | Admitting: Family Medicine

## 2021-12-14 ENCOUNTER — Other Ambulatory Visit: Payer: 59

## 2021-12-14 ENCOUNTER — Ambulatory Visit (INDEPENDENT_AMBULATORY_CARE_PROVIDER_SITE_OTHER): Payer: 59 | Admitting: General Practice

## 2021-12-14 ENCOUNTER — Encounter: Payer: Self-pay | Admitting: Family Medicine

## 2021-12-14 VITALS — BP 141/87 | HR 89 | Wt 193.0 lb

## 2021-12-14 DIAGNOSIS — Z23 Encounter for immunization: Secondary | ICD-10-CM | POA: Diagnosis not present

## 2021-12-14 DIAGNOSIS — O0990 Supervision of high risk pregnancy, unspecified, unspecified trimester: Secondary | ICD-10-CM

## 2021-12-14 DIAGNOSIS — R8271 Bacteriuria: Secondary | ICD-10-CM

## 2021-12-14 DIAGNOSIS — O10919 Unspecified pre-existing hypertension complicating pregnancy, unspecified trimester: Secondary | ICD-10-CM

## 2021-12-14 DIAGNOSIS — E1111 Type 2 diabetes mellitus with ketoacidosis with coma: Secondary | ICD-10-CM

## 2021-12-14 MED ORDER — NIFEDIPINE ER OSMOTIC RELEASE 30 MG PO TB24: 30.0000 mg | ORAL_TABLET | Freq: Every day | ORAL | 2 refills | Status: DC

## 2021-12-14 NOTE — Patient Instructions (Signed)

## 2021-12-14 NOTE — Progress Notes (Signed)
? ?  Subjective:  ?Sherri Lopez is a 21 y.o. G1P0000 at 13w4dbeing seen today for ongoing prenatal care.  She is currently monitored for the following issues for this high-risk pregnancy and has Asthma; BMI, pediatric > 99% for age; Secondary amenorrhea; Eczema; [name] nigricans; Hyperandrogenism; Acne vulgaris; Hirsutism; Ovarian cyst; Type 2 diabetes mellitus ([place]; Supervision of high risk pregnancy, antepartum; Polycystic ovarian syndrome; Chronic hypertension during pregnancy, antepartum; GBS bacteriuria; and Alpha thalassemia silent carrier on their problem list. ? ?Patient reports no complaints.  Contractions: Not present. Vag. Bleeding: None.  Movement: Present. Denies leaking of fluid.  ? ?The following portions of the patient's history were reviewed and updated as appropriate: allergies, current medications, past family history, past medical history, past social history, past surgical history and problem list. Problem list updated. ? ?Objective:  ? ?Vitals:  ? 12/14/21 1419 12/14/21 1422  ?BP: (!) 150/83 (!) 141/87  ?Pulse: 90 89  ?Weight: 193 lb (87.5 kg)   ? ? ?Fetal Status: Fetal Heart Rate (bpm): NST   Movement: Present    ? ?General:  Alert, oriented and cooperative. Patient is in no acute distress.  ?Skin: Skin is warm and dry. No rash noted.   ?Cardiovascular: Normal heart rate noted  ?Respiratory: Normal respiratory effort, no problems with respiration noted  ?Abdomen: Soft, gravid, appropriate for gestational age. Pain/Pressure: Absent     ?Pelvic: Vag. Bleeding: None     ?Cervical exam deferred        ?Extremities: Normal range of motion.  Edema: None  ?Mental Status: Normal mood and affect. Normal behavior. Normal judgment and thought content.  ? ?Urinalysis:     ? ?Assessment and Plan:  ?Pregnancy: G1P0000 at 325w4d ?1. Supervision of high risk pregnancy, antepartum ?BP borderline, see below ?TDAP given today ?FHR normal ?BPP 8/10 ?- Tdap vaccine greater than or equal to 7yo IM ? ?2.  Type 2 diabetes mellitus with ketoacidotic coma, unspecified whether long term insulin use (HCHigh [place]?Forgot log at home ?From recall has fastings in the 70-90's, post prandials ranging 130-160's ?Using omnipod, current settings: ?Basal - 1.3u ?Bolus - 8u w meals, 2.5u w snacks ?Will increase bolus to 10u and snacks to 3u ?Last growth US[place]on 12/02/2021, EFW 15%, normal AFI, Dr. BoGertie Exoneporting likely IOL at 37 wks if ongoing poor glucose control ?Discussed IOL with patient, understands why given mounting medical issues IOL at 37 weeks is indicated ?Form faxed, orders placed ?BPP 8/10 today ? ?3. Chronic hypertension during pregnancy, antepartum ?Not on any HTN meds ?Taking ASA ?Asymptomatic ?Start Nifedipine XL 30 mg daily ?Redraw baseline labs ? ?4. GBS bacteriuria ?PPX in labor ? ?Preterm labor symptoms and general obstetric precautions including but not limited to vaginal bleeding, contractions, leaking of fluid and fetal movement were reviewed in detail with the patient. ?Please refer to After Visit Summary for other counseling recommendations.  ?Return in 1 week (on 12/21/2021) for Dyad patient, [facility], needs MD. ? ? ?EcClarnce FlockMD ? ?

## 2021-12-14 NOTE — Progress Notes (Signed)
Pt informed that the ultrasound is considered a limited OB ultrasound and is not intended to be a complete ultrasound exam.  Patient also informed that the ultrasound is not being completed with the intent of assessing for fetal or placental anomalies or any pelvic abnormalities.  Explained that the purpose of today?s ultrasound is to assess for  BPP, presentation, and AFI.  Patient acknowledges the purpose of the exam and the limitations of the study. ? ?Koren Bound RN BSN ?12/14/21 ?    ?

## 2021-12-15 ENCOUNTER — Encounter (HOSPITAL_COMMUNITY): Payer: Self-pay | Admitting: *Deleted

## 2021-12-15 ENCOUNTER — Telehealth (HOSPITAL_COMMUNITY): Payer: Self-pay | Admitting: *Deleted

## 2021-12-15 LAB — COMPREHENSIVE METABOLIC PANEL
ALT: 9 IU/L (ref 0–32)
AST: 12 IU/L (ref 0–40)
Albumin/Globulin Ratio: 1.6 (ref 1.2–2.2)
Albumin: 4.2 g/dL (ref 3.9–5.0)
Alkaline Phosphatase: 160 IU/L — ABNORMAL HIGH (ref 44–121)
BUN/Creatinine Ratio: 10 (ref 9–23)
BUN: 7 mg/dL (ref 6–20)
Bilirubin Total: 0.2 mg/dL (ref 0.0–1.2)
CO2: 21 mmol/L (ref 20–29)
Calcium: 9.4 mg/dL (ref 8.7–10.2)
Chloride: 102 mmol/L (ref 96–106)
Creatinine, Ser: 0.7 mg/dL (ref 0.57–1.00)
Globulin, Total: 2.7 g/dL (ref 1.5–4.5)
Glucose: 128 mg/dL — ABNORMAL HIGH (ref 70–99)
Potassium: 4.1 mmol/L (ref 3.5–5.2)
Sodium: 138 mmol/L (ref 134–144)
Total Protein: 6.9 g/dL (ref 6.0–8.5)
eGFR: 126 mL/min/{1.73_m2} (ref >59)

## 2021-12-15 LAB — CBC
Hematocrit: 34.4 % (ref 34.0–46.6)
Hemoglobin: 11.4 g/dL (ref 11.1–15.9)
MCH: 24.7 pg — ABNORMAL LOW (ref 26.6–33.0)
MCHC: 33.1 g/dL (ref 31.5–35.7)
MCV: 75 fL — ABNORMAL LOW (ref 79–97)
Platelets: 401 10*3/uL (ref 150–450)
RBC: 4.61 x10E6/uL (ref 3.77–5.28)
RDW: 14.4 % (ref 11.7–15.4)
WBC: 9.2 10*3/uL (ref 3.4–10.8)

## 2021-12-15 LAB — GC/CHLAMYDIA PROBE AMP (~~LOC~~) NOT AT ARMC
Chlamydia: NEGATIVE
Comment: NEGATIVE
Comment: NORMAL
Neisseria Gonorrhea: NEGATIVE

## 2021-12-15 LAB — PROTEIN / CREATININE RATIO, URINE
Creatinine, Urine: 222.3 mg/dL
Protein, Ur: 34.4 mg/dL
Protein/Creat Ratio: 155 mg/g creat (ref 0–200)

## 2021-12-15 NOTE — Telephone Encounter (Signed)
Preadmission screen  

## 2021-12-16 ENCOUNTER — Other Ambulatory Visit: Payer: 59

## 2021-12-21 ENCOUNTER — Other Ambulatory Visit: Payer: Self-pay | Admitting: Women's Health

## 2021-12-23 ENCOUNTER — Ambulatory Visit (INDEPENDENT_AMBULATORY_CARE_PROVIDER_SITE_OTHER): Payer: 59

## 2021-12-23 ENCOUNTER — Ambulatory Visit: Payer: 59 | Admitting: *Deleted

## 2021-12-23 ENCOUNTER — Other Ambulatory Visit (HOSPITAL_COMMUNITY)
Admission: RE | Admit: 2021-12-23 | Discharge: 2021-12-23 | Disposition: A | Discharge status: Home / Self Care | Payer: 59 | Source: Ambulatory Visit | Attending: Family Medicine | Admitting: Family Medicine

## 2021-12-23 ENCOUNTER — Ambulatory Visit (INDEPENDENT_AMBULATORY_CARE_PROVIDER_SITE_OTHER): Payer: 59 | Admitting: Family Medicine

## 2021-12-23 VITALS — BP 142/87 | HR 97 | Wt 189.9 lb

## 2021-12-23 DIAGNOSIS — O0990 Supervision of high risk pregnancy, unspecified, unspecified trimester: Secondary | ICD-10-CM | POA: Insufficient documentation

## 2021-12-23 DIAGNOSIS — O24119 Pre-existing diabetes mellitus, type 2, in pregnancy, unspecified trimester: Secondary | ICD-10-CM

## 2021-12-23 DIAGNOSIS — O10919 Unspecified pre-existing hypertension complicating pregnancy, unspecified trimester: Secondary | ICD-10-CM | POA: Diagnosis not present

## 2021-12-23 NOTE — Progress Notes (Signed)
? ? ?  PRENATAL VISIT NOTE ? ?Subjective:  ?Sherri Lopez is a 21 y.o. G1P0000 at 34w6dbeing seen today for ongoing prenatal care.  She is currently monitored for the following issues for this high-risk pregnancy and has Asthma; BMI, pediatric > 99% for age; Secondary amenorrhea; Eczema; [name] nigricans; Hyperandrogenism; Acne vulgaris; Hirsutism; Ovarian cyst; Type 2 diabetes mellitus (H[place]; Supervision of high risk pregnancy, antepartum; Polycystic ovarian syndrome; Chronic hypertension during pregnancy, antepartum; GBS bacteriuria; and Alpha thalassemia silent carrier on their problem list. ? ?Patient reports no complaints.  Contractions: Not present. Vag. Bleeding: None.  Movement: Present. Denies leaking of fluid.  ? ?The following portions of the patient's history were reviewed and updated as appropriate: allergies, current medications, past family history, past medical history, past social history, past surgical history and problem list.  ? ?Objective:  ? ?Vitals:  ? 12/23/21 1553  ?BP: (!) 142/87  ?Pulse: 97  ?Weight: 189 lb 14.4 oz (86.1 kg)  ? ? ?Fetal Status: Fetal Heart Rate (bpm): NST   Movement: Present    ? ?General:  Alert, oriented and cooperative. Patient is in no acute distress.  ?Skin: Skin is warm and dry. No rash noted.   ?Cardiovascular: Normal heart rate noted  ?Respiratory: Normal respiratory effort, no problems with respiration noted  ?Abdomen: Soft, gravid, appropriate for gestational age.  Pain/Pressure: Present     ?Pelvic: Cervical exam performed in the presence of a chaperone      1/thick/-3  ?Extremities: Normal range of motion.     ?Mental Status: Normal mood and affect. Normal behavior. Normal judgment and thought content.  ? ?Assessment and Plan:  ?Pregnancy: G1P0000 at 357w6d1. Supervision of high risk pregnancy, antepartum ?Reviewed IOL tomorrow due to poorly control GDM ?- GC/Chlamydia probe amp ([place])[facility] ?Preterm labor symptoms and general obstetric  precautions including but not limited to vaginal bleeding, contractions, leaking of fluid and fetal movement were reviewed in detail with the patient. ?Please refer to After Visit Summary for other counseling recommendations.  ? ?No follow-ups on file. ? ?Future Appointments  ?Date Time Provider [place]?12/24/2021  6:45 AM MC-LD SCHED ROOM MC-INDC None  ?12/30/2021  9:35 AM NeCaren MacadamMD [facility]MP[facility]?12/31/2021 10:30 AM WMC-MFC NURSE WMC-MFC WMC  ?12/31/2021 10:45 AM WMC-MFC US6 WMC-MFCUS WMC  ?01/07/2022  9:35 AM EcDione PloverMaAnnice NeedyMD WMM[facility] CenterM[facility]?01/18/2022  3:55 PM EcDione PloverMaAnnice NeedyMD WMBaptist [facility] RockMC[facility]? ? ?KiCaren MacadamMD ? ?

## 2021-12-23 NOTE — Progress Notes (Signed)
Pt is not taking NPH insulin.  She started taking Nifedipine today - first dose.  ?

## 2021-12-24 ENCOUNTER — Inpatient Hospital Stay (HOSPITAL_COMMUNITY): Payer: 59

## 2021-12-24 ENCOUNTER — Inpatient Hospital Stay (HOSPITAL_COMMUNITY)
Admission: AD | Admit: 2021-12-24 | Discharge: 2021-12-27 | DRG: 806 | Disposition: A | Discharge status: Home / Self Care | Payer: 59 | Attending: Obstetrics & Gynecology | Admitting: Obstetrics & Gynecology

## 2021-12-24 ENCOUNTER — Other Ambulatory Visit: Payer: Self-pay

## 2021-12-24 ENCOUNTER — Encounter (HOSPITAL_COMMUNITY): Payer: Self-pay | Admitting: Family Medicine

## 2021-12-24 DIAGNOSIS — Z7984 Long term (current) use of oral hypoglycemic drugs: Secondary | ICD-10-CM | POA: Diagnosis not present

## 2021-12-24 DIAGNOSIS — O1002 Pre-existing essential hypertension complicating childbirth: Secondary | ICD-10-CM | POA: Diagnosis present

## 2021-12-24 DIAGNOSIS — E119 Type 2 diabetes mellitus without complications: Secondary | ICD-10-CM

## 2021-12-24 DIAGNOSIS — Z880 Allergy status to penicillin: Secondary | ICD-10-CM | POA: Diagnosis not present

## 2021-12-24 DIAGNOSIS — Z794 Long term (current) use of insulin: Secondary | ICD-10-CM | POA: Diagnosis not present

## 2021-12-24 DIAGNOSIS — O99824 Streptococcus B carrier state complicating childbirth: Secondary | ICD-10-CM | POA: Diagnosis present

## 2021-12-24 DIAGNOSIS — Z23 Encounter for immunization: Secondary | ICD-10-CM

## 2021-12-24 DIAGNOSIS — Z7982 Long term (current) use of aspirin: Secondary | ICD-10-CM

## 2021-12-24 DIAGNOSIS — E1165 Type 2 diabetes mellitus with hyperglycemia: Secondary | ICD-10-CM | POA: Diagnosis present

## 2021-12-24 DIAGNOSIS — O24419 Gestational diabetes mellitus in pregnancy, unspecified control: Secondary | ICD-10-CM | POA: Insufficient documentation

## 2021-12-24 DIAGNOSIS — O0990 Supervision of high risk pregnancy, unspecified, unspecified trimester: Secondary | ICD-10-CM

## 2021-12-24 DIAGNOSIS — R8271 Bacteriuria: Secondary | ICD-10-CM | POA: Diagnosis present

## 2021-12-24 DIAGNOSIS — Z3A37 37 weeks gestation of pregnancy: Secondary | ICD-10-CM

## 2021-12-24 DIAGNOSIS — O114 Pre-existing hypertension with pre-eclampsia, complicating childbirth: Secondary | ICD-10-CM | POA: Diagnosis present

## 2021-12-24 DIAGNOSIS — O119 Pre-existing hypertension with pre-eclampsia, unspecified trimester: Secondary | ICD-10-CM

## 2021-12-24 DIAGNOSIS — O10919 Unspecified pre-existing hypertension complicating pregnancy, unspecified trimester: Secondary | ICD-10-CM | POA: Diagnosis present

## 2021-12-24 DIAGNOSIS — O2412 Pre-existing diabetes mellitus, type 2, in childbirth: Principal | ICD-10-CM | POA: Diagnosis present

## 2021-12-24 DIAGNOSIS — D563 Thalassemia minor: Secondary | ICD-10-CM | POA: Diagnosis present

## 2021-12-24 DIAGNOSIS — Z8759 Personal history of other complications of pregnancy, childbirth and the puerperium: Secondary | ICD-10-CM

## 2021-12-24 LAB — CBC
HCT: 32.4 % — ABNORMAL LOW (ref 36.0–46.0)
HCT: 35.3 % — ABNORMAL LOW (ref 36.0–46.0)
Hemoglobin: 10.7 g/dL — ABNORMAL LOW (ref 12.0–15.0)
Hemoglobin: 11.7 g/dL — ABNORMAL LOW (ref 12.0–15.0)
MCH: 24.8 pg — ABNORMAL LOW (ref 26.0–34.0)
MCH: 24.9 pg — ABNORMAL LOW (ref 26.0–34.0)
MCHC: 33 g/dL (ref 30.0–36.0)
MCHC: 33.1 g/dL (ref 30.0–36.0)
MCV: 75 fL — ABNORMAL LOW (ref 80.0–100.0)
MCV: 75.1 fL — ABNORMAL LOW (ref 80.0–100.0)
Platelets: 364 10*3/uL (ref 150–400)
Platelets: 383 10*3/uL (ref 150–400)
RBC: 4.32 MIL/uL (ref 3.87–5.11)
RBC: 4.7 MIL/uL (ref 3.87–5.11)
RDW: 14.7 % (ref 11.5–15.5)
RDW: 14.9 % (ref 11.5–15.5)
WBC: 8.9 10*3/uL (ref 4.0–10.5)
WBC: 8.8 10*3/uL (ref 4.0–10.5)
nRBC: 0 % (ref 0.0–0.2)
nRBC: 0 % (ref 0.0–0.2)

## 2021-12-24 LAB — COMPREHENSIVE METABOLIC PANEL
ALT: 12 U/L (ref 0–44)
ALT: 15 U/L (ref 0–44)
AST: 17 U/L (ref 15–41)
AST: 20 U/L (ref 15–41)
Albumin: 2.9 g/dL — ABNORMAL LOW (ref 3.5–5.0)
Albumin: 2.8 g/dL — ABNORMAL LOW (ref 3.5–5.0)
Alkaline Phosphatase: 136 U/L — ABNORMAL HIGH (ref 38–126)
Alkaline Phosphatase: 135 U/L — ABNORMAL HIGH (ref 38–126)
Anion gap: 11 (ref 5–15)
Anion gap: 10 (ref 5–15)
BUN: 9 mg/dL (ref 6–20)
BUN: 8 mg/dL (ref 6–20)
CO2: 20 mmol/L — ABNORMAL LOW (ref 22–32)
CO2: 19 mmol/L — ABNORMAL LOW (ref 22–32)
Calcium: 9.2 mg/dL (ref 8.9–10.3)
Calcium: 9 mg/dL (ref 8.9–10.3)
Chloride: 105 mmol/L (ref 98–111)
Chloride: 105 mmol/L (ref 98–111)
Creatinine, Ser: 0.62 mg/dL (ref 0.44–1.00)
Creatinine, Ser: 0.65 mg/dL (ref 0.44–1.00)
GFR, Estimated: >60 mL/min (ref >60)
GFR, Estimated: >60 mL/min (ref >60)
Glucose, Bld: 103 mg/dL — ABNORMAL HIGH (ref 70–99)
Glucose, Bld: 106 mg/dL — ABNORMAL HIGH (ref 70–99)
Potassium: 3.5 mmol/L (ref 3.5–5.1)
Potassium: 3.7 mmol/L (ref 3.5–5.1)
Sodium: 136 mmol/L (ref 135–145)
Sodium: 134 mmol/L — ABNORMAL LOW (ref 135–145)
Total Bilirubin: 0.3 mg/dL (ref 0.3–1.2)
Total Bilirubin: 0.6 mg/dL (ref 0.3–1.2)
Total Protein: 6.5 g/dL (ref 6.5–8.1)
Total Protein: 6.8 g/dL (ref 6.5–8.1)

## 2021-12-24 LAB — PREPARE RBC (CROSSMATCH)

## 2021-12-24 LAB — GLUCOSE, CAPILLARY
Glucose-Capillary: 108 mg/dL — ABNORMAL HIGH (ref 70–99)
Glucose-Capillary: 117 mg/dL — ABNORMAL HIGH (ref 70–99)

## 2021-12-24 LAB — ABO/RH: ABO/RH(D): O POS

## 2021-12-24 LAB — PROTEIN / CREATININE RATIO, URINE
Creatinine, Urine: 161.54 mg/dL
Protein Creatinine Ratio: 0.32 mg/mg{Cre} — ABNORMAL HIGH (ref 0.00–0.15)
Total Protein, Urine: 51 mg/dL

## 2021-12-24 LAB — RPR: RPR Ser Ql: NONREACTIVE

## 2021-12-24 MED ORDER — NIFEDIPINE ER OSMOTIC RELEASE 30 MG PO TB24
30.0000 mg | ORAL_TABLET | Freq: Two times a day (BID) | ORAL | Status: DC
  Administered 2021-12-24: 30 mg via ORAL
  Filled 2021-12-24 (×4): qty 1

## 2021-12-24 MED ORDER — FENTANYL-BUPIVACAINE-NACL 0.5-0.125-0.9 MG/250ML-% EP SOLN
12.0000 mL/h | EPIDURAL | Status: DC | PRN
  Administered 2021-12-25: 12 mL/h via EPIDURAL
  Filled 2021-12-24: qty 250

## 2021-12-24 MED ORDER — EPHEDRINE 5 MG/ML INJ: 10.0000 mg | INTRAVENOUS | Status: DC | PRN

## 2021-12-24 MED ORDER — FENTANYL CITRATE (PF) 100 MCG/2ML IJ SOLN
50.0000 ug | INTRAMUSCULAR | Status: DC | PRN
  Administered 2021-12-24 (×4): 100 ug via INTRAVENOUS
  Filled 2021-12-24 (×4): qty 2

## 2021-12-24 MED ORDER — TERBUTALINE SULFATE 1 MG/ML IJ SOLN: 0.2500 mg | Freq: Once | INTRAMUSCULAR | Status: DC | PRN

## 2021-12-24 MED ORDER — PHENYLEPHRINE 80 MCG/ML (10ML) SYRINGE FOR IV PUSH (FOR BLOOD PRESSURE SUPPORT)
80.0000 ug | PREFILLED_SYRINGE | INTRAVENOUS | Status: DC | PRN
Start: 2021-12-24 — End: 2021-12-25

## 2021-12-24 MED ORDER — CLINDAMYCIN PHOSPHATE 900 MG/50ML IV SOLN
900.0000 mg | Freq: Three times a day (TID) | INTRAVENOUS | Status: DC
  Administered 2021-12-24 – 2021-12-25 (×3): 900 mg via INTRAVENOUS
  Filled 2021-12-24 (×4): qty 50

## 2021-12-24 MED ORDER — ACETAMINOPHEN 325 MG PO TABS: 650.0000 mg | ORAL_TABLET | ORAL | Status: DC | PRN

## 2021-12-24 MED ORDER — LACTATED RINGERS IV SOLN
INTRAVENOUS | Status: DC
Start: 2021-12-24 — End: 2021-12-25

## 2021-12-24 MED ORDER — INSULIN ASPART 100 UNIT/ML IJ SOLN
0.0000 [IU] | INTRAMUSCULAR | Status: DC
  Administered 2021-12-24 – 2021-12-25 (×4): 1 [IU] via SUBCUTANEOUS
  Administered 2021-12-25: 2 [IU] via SUBCUTANEOUS

## 2021-12-24 MED ORDER — MISOPROSTOL 50MCG HALF TABLET
50.0000 ug | ORAL_TABLET | ORAL | Status: DC
  Administered 2021-12-24: 50 ug via BUCCAL
  Filled 2021-12-24: qty 1

## 2021-12-24 MED ORDER — OXYTOCIN-SODIUM CHLORIDE 30-0.9 UT/500ML-% IV SOLN
1.0000 m[IU]/min | INTRAVENOUS | Status: DC
  Administered 2021-12-24 – 2021-12-25 (×2): 2 m[IU]/min via INTRAVENOUS
  Filled 2021-12-24: qty 500

## 2021-12-24 MED ORDER — EPHEDRINE 5 MG/ML INJ
10.0000 mg | INTRAVENOUS | Status: DC | PRN
Start: 2021-12-24 — End: 2021-12-25

## 2021-12-24 MED ORDER — OXYTOCIN BOLUS FROM INFUSION
333.0000 mL | Freq: Once | INTRAVENOUS | Status: AC
  Administered 2021-12-25: 333 mL via INTRAVENOUS

## 2021-12-24 MED ORDER — OXYTOCIN-SODIUM CHLORIDE 30-0.9 UT/500ML-% IV SOLN: 2.5000 [IU]/h | INTRAVENOUS | Status: DC

## 2021-12-24 MED ORDER — SODIUM CHLORIDE 0.9% IV SOLUTION: Freq: Once | INTRAVENOUS | Status: DC

## 2021-12-24 MED ORDER — LACTATED RINGERS IV SOLN: 500.0000 mL | Freq: Once | INTRAVENOUS | Status: DC

## 2021-12-24 MED ORDER — LABETALOL HCL 200 MG PO TABS: 200.0000 mg | ORAL_TABLET | Freq: Two times a day (BID) | ORAL | Status: DC

## 2021-12-24 MED ORDER — LIDOCAINE HCL (PF) 1 % IJ SOLN: 30.0000 mL | INTRAMUSCULAR | Status: DC | PRN

## 2021-12-24 MED ORDER — SOD CITRATE-CITRIC ACID 500-334 MG/5ML PO SOLN
30.0000 mL | ORAL | Status: DC | PRN
Start: 2021-12-24 — End: 2021-12-25

## 2021-12-24 MED ORDER — DIPHENHYDRAMINE HCL 50 MG/ML IJ SOLN: 12.5000 mg | INTRAMUSCULAR | Status: DC | PRN

## 2021-12-24 MED ORDER — ONDANSETRON HCL 4 MG/2ML IJ SOLN: 4.0000 mg | Freq: Four times a day (QID) | INTRAMUSCULAR | Status: DC | PRN

## 2021-12-24 MED ORDER — NIFEDIPINE ER OSMOTIC RELEASE 30 MG PO TB24
30.0000 mg | ORAL_TABLET | Freq: Every day | ORAL | Status: DC
Start: 2021-12-24 — End: 2021-12-24
  Administered 2021-12-24: 30 mg via ORAL
  Filled 2021-12-24: qty 1

## 2021-12-24 MED ORDER — PHENYLEPHRINE 80 MCG/ML (10ML) SYRINGE FOR IV PUSH (FOR BLOOD PRESSURE SUPPORT): 80.0000 ug | PREFILLED_SYRINGE | INTRAVENOUS | Status: DC | PRN

## 2021-12-24 MED ORDER — LACTATED RINGERS IV SOLN: 500.0000 mL | INTRAVENOUS | Status: DC | PRN

## 2021-12-24 NOTE — Progress Notes (Signed)
Sherri Lopez is a 21 y.o. G1P0000 at 1w0dadmitted for induction of labor due to [facility]on [facility]s and T2DM. ? ?Subjective: ?Reports she feels some cramping. Otherwise feels well.  ? ?Confirmed that she takes Procardia at home. Took last dose yesterday afternoon (over 24 hours ago) ? ?Objective: ?BP (!) 146/83   Pulse 75   Temp 98 ?F (36.7 ?C)   Resp 17   Ht '5\' 2"'$  (1.575 m)   Wt 86.9 kg   LMP 03/27/2021 (Exact Date)   BMI 35.04 kg/m?  ?No intake/output data recorded. ?No intake/output data recorded. ? ?FHT:  FHR: 150 bpm, variability: moderate,  accelerations:  Present,  decelerations:  Absent ?UC:   irregular ?SVE:   Dilation: 2.5 ?Effacement (%): 50 ?Station: -3 ?Exam by:: SMaryln Manuel RN ? ?Labs: ?Lab Results  ?Component Value Date  ? WBC 8.9 12/24/2021  ? HGB 10.7 (L) 12/24/2021  ? HCT 32.4 (L) 12/24/2021  ? MCV 75.0 (L) 12/24/2021  ? PLT 364 12/24/2021  ? ? ?Assessment / Plan: ? G1P0000 at 37w0ddmitted for induction of labor due to cHSpect[facility] Blod[facility] Campusnow with SIPE without SF) on meds and T2DM. ? ?Labor: patient received one cytotec ~11AM.  Will plan for recheck in 4 hours from cytotec and assess for balloon if appropriate. ?cHTN with SIPE (no SF):   no SF. BP in 140s-150s. On procardia at home. Will restart home procardia and continue to monitor. ?Fetal Wellbeing:  Category I ?Pain Control:  Epidural ?I/D:   GBS pos >PCN ? ?#DMII ?Patient previously on Tresiba and also mealtime Novolog insulin. In pregnancy has Omnipod but currently her Omnipod does not have insulin in it.  ? ?Will plan for q4hr checks and SSI. Will switch to q2hr checks in active labor ? ?AnRenard Matter5/07/2022, 1:22 PM ? ? ?

## 2021-12-24 NOTE — Progress Notes (Addendum)
Inpatient Diabetes Program Recommendations ? ?AACE/ADA: New Consensus Statement on Inpatient Glycemic Control (2015) ? ?Target Ranges:  Prepandial:   less than 140 mg/dL ?     Peak postprandial:   less than 180 mg/dL (1-2 hours) ?     Critically ill patients:  140 - 180 mg/dL  ? ?Lab Results  ?Component Value Date  ? GLUCAP 108 (H) 12/24/2021  ? HGBA1C 8.2 (H) 11/15/2021  ? ? ?Review of Glycemic Control ? Latest Reference Range & Units 12/24/21 09:50  ?Glucose-Capillary 70 - 99 mg/dL 108 (H)  ?(H): Data is abnormally high ? ?Diabetes history: DM2 ? ?Outpatient Diabetes medications: Metformin 750 mg BID ? ?Omnipod insulin pump ?Basal-1.3 units/h (total basal of 31.2 units in 24 hrs) ?Meal coverage-10 units with meals and 3.5 units with snacks ?Target BG 110-110 ? ?Current orders for Inpatient glycemic control:  ?Insulin pump ? ?Inpatient Diabetes Program Recommendations:   ? ?Spoke with patient over the phone as this DM coordinator is working at [employer].  She verbalizes above insulin pump settings.  She is getting ready to place a new Omnipod on.  This is her first baby and she is being induced for DM.  Discussed reduced insulin needs at delivery of placenta.   ? ?Please order insulin pump therapy. ? ?Please consider suspending Omnipod once in active labor and starting Novolog 0-14 units Q4H for remainder of labor.   ? ?She was on Tresiba 32 units prior to pregnancy and Novolog CHO coverage at 1 unit: 10 CHO's.  ? ?She states she would like to wear her pump after delivery.  Please consider decreasing her setting to 1/3 of pregnancy settings which will be as followed: ? ?Basal-0.43 units/hr (total basal of 10.32 in 24 hrs) ?CHO ratio-1 unit: 10 CHO's ? ? ?Will continue to follow while inpatient. ? ?Thank you, ?Reche Dixon, MSN, CDCES ?Diabetes Coordinator ?Inpatient Diabetes Program ?[phone] (team pager from 8a-5p) ? ? ? ? ? ?

## 2021-12-24 NOTE — Progress Notes (Signed)
Sherri Lopez is a 21 y.o. G1P0000 at 64w0dadmitted for induction of labor due to c[facility] Serviceswith S[facility] (without SF) and T2DM on insulin. ? ?Subjective: ?Doing well. Denies headache. OEl Paraisowith check and AROM at this time if appropriate. ? ?Objective: ?BP (!) 143/85   Pulse 91   Temp 98.7 ?F (37.1 ?C)   Resp 16   Ht '5\' 2"'$  (1.575 m)   Wt 86.9 kg   LMP 03/27/2021 (Exact Date)   BMI 35.04 kg/m?  ?No intake/output data recorded. ?No intake/output data recorded. ? ?FHT:  FHR: 150 bpm, variability: moderate,  accelerations:  Present,  decelerations:  Absent ?UC:   irregular, every 2-4 minutes ?SVE:   Dilation: 5 ?Effacement (%): 50 ?Station: -3, -2 ?Exam by:: Dr DCy Blamer? ?Labs: ?Lab Results  ?Component Value Date  ? WBC 8.9 12/24/2021  ? HGB 10.7 (L) 12/24/2021  ? HCT 32.4 (L) 12/24/2021  ? MCV 75.0 (L) 12/24/2021  ? PLT 364 12/24/2021  ? ? ?Assessment / Plan: ?G1P0000 at 349w0ddmitted for induction of labor due to cHTN with SIPE (without SF) and T2DM on insulin. ? ?Labor:  s/p cytotec x1 and FB. Cervix now 5 cm and 50% effaced. AROM conducted during current check with copious clear fluid. Tolerated well by fetus and mother ?Preeclampsia:   cHTN with SIPE (w/o SF). BP in 140s-150s. Will get repeat labs. Additional '30mg'$  of Procardia given (for total of '60mg'$ ).  ?Fetal Wellbeing:  Category I ?Pain Control:  IV pain meds and considering epidural ?I/D:   GBS pos>clinda ? ?#T2DM ?Q4hr checks with SSI. Switch to q2hr when more active ? ?AnRenard MatterMD, MPH ?OB Fellow, Faculty Practice ? ? ?

## 2021-12-24 NOTE — H&P (Addendum)
OBSTETRIC ADMISSION HISTORY AND PHYSICAL ? ?Sherri Lopez is a 21 y.o. female G1P0000 with IUP at [redacted]w[redacted]d by [place] presenting for IOL due to poorly controlled T2DM. She reports +FMs, No LOF, no VB, no blurry vision, headaches or peripheral edema, and RUQ pain.  She plans on breast and formula feeding. She requests POPs for birth control. ?She received her prenatal care at [facility]  ? ?Dating: By [place] --->  Estimated Date of Delivery: 01/14/22 ? ?Sono:   ? ?$R'@[redacted]w[redacted]d'Kh$ , CWD, normal anatomy, Cephalic presentation,  5631S, 15% EFW ? ? ?Prenatal History/Complications:  ?-Hx of cHTN ?-Silent Carrier alpha Thal  ?-LR NIPS ?-T2DM on insulin and Metformin  ? ?Past Medical History: ?Past Medical History:  ?Diagnosis Date  ? Asthma   ? Diabetes mellitus without complication ([place])   ? Hypertension   ? ? ?Past Surgical History: ?History reviewed. No pertinent surgical history. ? ?Obstetrical History: ?OB History   ? ? Gravida  ?1  ? Para  ?0  ? Term  ?0  ? Preterm  ?0  ? AB  ?0  ? Living  ?0  ?  ? ? SAB  ?0  ? IAB  ?0  ? Ectopic  ?0  ? Multiple  ?0  ? Live Births  ?0  ?   ?  ?  ? ? ?Social History ?Social History  ? ?Socioeconomic History  ? Marital status: Single  ?  Spouse name: Not on file  ? Number of children: Not on file  ? Years of education: Not on file  ? Highest education level: Not on file  ?Occupational History  ? Not on file  ?Tobacco Use  ? Smoking status: Never  ? Smokeless tobacco: Never  ?Vaping Use  ? Vaping Use: Former  ? Start date: 07/26/2016  ?Substance and Sexual Activity  ? Alcohol use: No  ?  Alcohol/week: 0.0 standard drinks  ? Drug use: No  ? Sexual activity: Yes  ?Other Topics Concern  ? Not on file  ?Social History Narrative  ? Lives with both parents and 3 siblings.  Attends [employer], 12th grade. Applying to colleges, thinking about [place].  Wants to join the [employer] to be an Chief Financial Officer.  No sports or other activities.  Likes to watch TV and read books.  Likes crime show. Likes [employer].    ?   ?  Confidentiality was discussed with the patient and if applicable, with caregiver as well.  ?   ? Patient's personal or confidential phone number: [phone]  ? Tobacco?  no  ? Drugs/ETOH?  no  ? Partner preference?  female Sexually Active?  no   ? Pregnancy Prevention:  none, reviewed condoms & plan B  ? Safe at home, in school & in relationships?  yes  ? Safe to self?   yes  ? Guns in the home?  no  ?    ? ?Social Determinants of Health  ? ?Financial Resource Strain: Not on file  ?Food Insecurity: Not on file  ?Transportation Needs: Not on file  ?Physical Activity: Not on file  ?Stress: Not on file  ?Social Connections: Not on file  ? ? ?Family History: ?Family History  ?Adopted: Yes  ?Problem Relation Age of Onset  ? Polycystic ovary syndrome Mother   ? Arthritis Mother   ? Thyroid disease Mother   ? Hearing loss Father   ? Lupus Maternal Aunt   ? Diabetes type II Paternal Grandmother   ? ? ?Allergies: ?Allergies  ?  Allergen Reactions  ? Penicillins Rash and Other (See Comments)  ?  Has patient had a PCN reaction causing immediate rash, facial/tongue/throat swelling, SOB or lightheadedness with hypotension: No ?Has patient had a PCN reaction causing severe rash involving mucus membranes or skin necrosis: No ?Has patient had a PCN reaction that required hospitalization No ?Has patient had a PCN reaction occurring within the last 10 years: No ?If all of the above answers are "NO", then may proceed with Cephalosporin use.  ? ? ?Pt denies allergies to latex, iodine, or shellfish. ? ?Medications Prior to Admission  ?Medication Sig Dispense Refill Last Dose  ? aspirin EC 81 MG tablet Take 1 tablet (81 mg total) by mouth daily. Swallow whole. 90 tablet 3 12/23/2021  ? Insulin Disposable Pump (OMNIPOD 5 G6 POD, GEN 5,) MISC Inject 1 Device into the skin every other day. 3 each 2 Past Week  ? insulin lispro (HUMALOG) 100 UNIT/ML injection For continuous infusion in Omnipod. To be adjusted per Provider. Currently on 63  units daily plus priming. 30 mL 6 Past Week  ? insulin NPH Human (HUMULIN N) 100 UNIT/ML injection Inject 0.14 mLs (14 Units total) into the skin 2 (two) times daily before a meal. 10 mL 11 Past Week  ? metFORMIN (GLUCOPHAGE XR) 750 MG 24 hr tablet Take 1 tablet (750 mg total) by mouth 2 (two) times daily. Take 1 tablet twice daily 180 tablet 3 Past Week  ? NIFEdipine (PROCARDIA-XL/NIFEDICAL-XL) 30 MG 24 hr tablet Take 1 tablet (30 mg total) by mouth daily. 30 tablet 2 12/23/2021  ? prenatal vitamin w/FE, FA (NATACHEW) 29-1 MG CHEW chewable tablet Chew 1 tablet by mouth daily at 12 noon.   12/23/2021  ? Vitamin D, Ergocalciferol, (DRISDOL) 50000 units CAPS capsule TAKE 1 CAPSULE (50,000 UNITS TOTAL) BY MOUTH EVERY 7 (SEVEN) DAYS. 12 capsule 0 Past Week  ? Accu-Chek FastClix Lancets MISC 1 each by Does not apply route as directed. Check sugar 6 x daily 204 each 3   ? Continuous Blood Gluc Receiver (FREESTYLE LIBRE 2 READER) DEVI  (Patient not taking: Reported on 12/23/2021)     ? Continuous Blood Gluc Sensor (FREESTYLE LIBRE 3 SENSOR) MISC 1 each by Does not apply route every 14 (fourteen) days. Place 1 sensor on the skin every 14 days. Use to check glucose continuously (Patient not taking: Reported on 12/23/2021) 2 each 1   ? Continuous Blood Gluc Transmit (DEXCOM G6 TRANSMITTER) MISC 1 Device by Does not apply route as needed. (Patient not taking: Reported on 12/23/2021) 1 each 0   ? glucagon 1 MG injection Use for Severe Hypoglycemia . Inject 1 mg intramuscularly if unresponsive, unable to swallow, unconscious and/or has seizure (Patient not taking: Reported on 12/23/2021) 1 kit 3 Unknown  ? glucose blood test strip Use as instructed 100 each 12   ? INS SYRINGE/NEEDLE .5CC/27G 27G X 1/2" 0.5 ML MISC 1 Device by Does not apply route as needed. (Patient not taking: Reported on 12/23/2021) 100 each 11   ? insulin degludec (TRESIBA FLEXTOUCH) 100 UNIT/ML SOPN FlexTouch Pen Use up to 50 units daily (Patient not taking:  Reported on 12/23/2021) 45 mL 1 Unknown  ? Insulin Pen Needle (BD PEN NEEDLE NANO U/F) 32G X 4 MM MISC USE TO INJECT INSULIN VIA INSULIN PEN 6 TIMES DAILY (Patient not taking: Reported on 12/23/2021) 600 each 3 Unknown  ? ? ? ?Review of Systems  ? ?All systems reviewed and negative except as stated in HPI ? ?  Blood pressure (!) 139/97, pulse (!) 106, temperature 98.4 ?F (36.9 ?C), temperature source Oral, resp. rate 18, height 5' 2"  (1.575 m), weight 86.9 kg, last menstrual period 03/27/2021. ?General appearance: alert, cooperative, and appears stated age ?Lungs: clear to auscultation bilaterally ?Heart: regular rate and rhythm ?Abdomen: soft, non-tender; bowel sounds normal ?Extremities: Homans sign is negative, no sign of DVT ?Presentation: cephalic ?Fetal monitoringBaseline: 140 bpm, Variability: Good {> 6 bpm), and Accelerations: Reactive ?Uterine activityNone ?Dilation: 2.5 ?Effacement (%): 30 ?Exam by:: Maryln Manuel, RN ? ? ?Prenatal labs: ?ABO, Rh: --/--/PENDING (05/12 4097) ?Antibody: PENDING (05/12 3532) ?Rubella: 3.72 (12/21 1453) ?RPR: Non Reactive (12/21 1453)  ?HBsAg: Negative (12/21 1453)  ?HIV: Non Reactive (12/21 1453)  ?GBS: Positive/-- (12/21 0000)  ?1 hr Glucola: Abnormal ?Genetic screening:  silent Alpha thal, LR NIPS ?Anatomy US: Normal ? ?Prenatal Transfer Tool  ?Maternal Diabetes: Yes:  Diabetes Type:  Insulin/Medication controlled ?Genetic Screening: Abnormal:  Results: Other:silent alpha tal ?Maternal Ultrasounds/Referrals: Normal ?Fetal Ultrasounds or other Referrals:  Fetal echo Normal ?Maternal Substance Abuse:  No ?Significant Maternal Medications:  Meds include: Other: metformin/insulin ?Significant Maternal Lab Results: Group B Strep positive ? ?Results for orders placed or performed during the hospital encounter of 12/24/21 (from the past 24 hour(s))  ?CBC  ? Collection Time: 12/24/21  9:05 AM  ?Result Value Ref Range  ? WBC 8.9 4.0 - 10.5 K/uL  ? RBC 4.32 3.87 - 5.11 MIL/uL  ? Hemoglobin  10.7 (L) 12.0 - 15.0 g/dL  ? HCT 32.4 (L) 36.0 - 46.0 %  ? MCV 75.0 (L) 80.0 - 100.0 fL  ? MCH 24.8 (L) 26.0 - 34.0 pg  ? MCHC 33.0 30.0 - 36.0 g/dL  ? RDW 14.7 11.5 - 15.5 %  ? Platelets 364 150 - 400 K/uL  ? nRBC

## 2021-12-24 NOTE — Progress Notes (Signed)
Patient doing well.  Having some cramping with contractions. ? ?Cervix checked. 2-2.5 cm/50/-3. ? ?Discussed FB with patient and patient amenable. Placed FB with 60cc of fluid. Tolerated well by fetus and patient.  ? ?Contracting frequently on monitor. Will plan to assess contraction pattern for 20 mins. If not contracting too much will give another dose of cytotec. ? ? ?Blood pressures continue to be hypertensive in the 140s-150s. Asymptomatic. Will plan to continue to assess. If persistently in 150s can give another dose of Procardia 30 and switch to Procardia '60mg'$  for tomorrow's dose. ? ?Renard Matter, MD, MPH ?OB Fellow, Faculty Practice ? ?

## 2021-12-25 ENCOUNTER — Inpatient Hospital Stay (HOSPITAL_COMMUNITY): Payer: 59 | Admitting: Anesthesiology

## 2021-12-25 DIAGNOSIS — O2412 Pre-existing diabetes mellitus, type 2, in childbirth: Secondary | ICD-10-CM

## 2021-12-25 DIAGNOSIS — O119 Pre-existing hypertension with pre-eclampsia, unspecified trimester: Secondary | ICD-10-CM

## 2021-12-25 DIAGNOSIS — Z3A37 37 weeks gestation of pregnancy: Secondary | ICD-10-CM

## 2021-12-25 DIAGNOSIS — O114 Pre-existing hypertension with pre-eclampsia, complicating childbirth: Secondary | ICD-10-CM

## 2021-12-25 DIAGNOSIS — O99824 Streptococcus B carrier state complicating childbirth: Secondary | ICD-10-CM

## 2021-12-25 DIAGNOSIS — Z8759 Personal history of other complications of pregnancy, childbirth and the puerperium: Secondary | ICD-10-CM

## 2021-12-25 LAB — CBC
HCT: 35.3 % — ABNORMAL LOW (ref 36.0–46.0)
Hemoglobin: 11.9 g/dL — ABNORMAL LOW (ref 12.0–15.0)
MCH: 25.1 pg — ABNORMAL LOW (ref 26.0–34.0)
MCHC: 33.7 g/dL (ref 30.0–36.0)
MCV: 74.3 fL — ABNORMAL LOW (ref 80.0–100.0)
Platelets: 385 10*3/uL (ref 150–400)
RBC: 4.75 MIL/uL (ref 3.87–5.11)
RDW: 14.7 % (ref 11.5–15.5)
WBC: 21.8 10*3/uL — ABNORMAL HIGH (ref 4.0–10.5)
nRBC: 0 % (ref 0.0–0.2)

## 2021-12-25 LAB — GLUCOSE, CAPILLARY: Glucose-Capillary: 208 mg/dL — ABNORMAL HIGH (ref 70–99)

## 2021-12-25 MED ORDER — BENZOCAINE-MENTHOL 20-0.5 % EX AERO
1.0000 "application " | INHALATION_SPRAY | CUTANEOUS | Status: DC | PRN
  Administered 2021-12-25 – 2021-12-27 (×2): 1 via TOPICAL
  Filled 2021-12-25 (×2): qty 56

## 2021-12-25 MED ORDER — WITCH HAZEL-GLYCERIN EX PADS: 1.0000 "application " | MEDICATED_PAD | CUTANEOUS | Status: DC | PRN

## 2021-12-25 MED ORDER — NIFEDIPINE ER OSMOTIC RELEASE 30 MG PO TB24
60.0000 mg | ORAL_TABLET | Freq: Two times a day (BID) | ORAL | Status: DC
  Administered 2021-12-25 – 2021-12-27 (×5): 60 mg via ORAL
  Filled 2021-12-25 (×5): qty 2

## 2021-12-25 MED ORDER — DIPHENHYDRAMINE HCL 25 MG PO CAPS: 25.0000 mg | ORAL_CAPSULE | Freq: Four times a day (QID) | ORAL | Status: DC | PRN

## 2021-12-25 MED ORDER — INSULIN ASPART 100 UNIT/ML IJ SOLN
0.0000 [IU] | Freq: Every day | INTRAMUSCULAR | Status: DC
  Administered 2021-12-25: 2 [IU] via SUBCUTANEOUS

## 2021-12-25 MED ORDER — ACETAMINOPHEN 325 MG PO TABS: 650.0000 mg | ORAL_TABLET | ORAL | Status: DC | PRN

## 2021-12-25 MED ORDER — COCONUT OIL OIL: 1.0000 "application " | TOPICAL_OIL | Status: DC | PRN

## 2021-12-25 MED ORDER — TETANUS-DIPHTH-ACELL PERTUSSIS 5-2.5-18.5 LF-MCG/0.5 IM SUSY: 0.5000 mL | PREFILLED_SYRINGE | Freq: Once | INTRAMUSCULAR | Status: DC

## 2021-12-25 MED ORDER — IBUPROFEN 600 MG PO TABS
600.0000 mg | ORAL_TABLET | Freq: Four times a day (QID) | ORAL | Status: DC
  Administered 2021-12-25 – 2021-12-27 (×7): 600 mg via ORAL
  Filled 2021-12-25 (×7): qty 1

## 2021-12-25 MED ORDER — INSULIN ASPART 100 UNIT/ML IJ SOLN
0.0000 [IU] | Freq: Three times a day (TID) | INTRAMUSCULAR | Status: DC
  Administered 2021-12-26: 2 [IU] via SUBCUTANEOUS

## 2021-12-25 MED ORDER — FUROSEMIDE 20 MG PO TABS
20.0000 mg | ORAL_TABLET | Freq: Every day | ORAL | Status: DC
  Administered 2021-12-25 – 2021-12-27 (×3): 20 mg via ORAL
  Filled 2021-12-25 (×3): qty 1

## 2021-12-25 MED ORDER — DIBUCAINE (PERIANAL) 1 % EX OINT: 1.0000 "application " | TOPICAL_OINTMENT | CUTANEOUS | Status: DC | PRN

## 2021-12-25 MED ORDER — NIFEDIPINE ER OSMOTIC RELEASE 30 MG PO TB24: 30.0000 mg | ORAL_TABLET | Freq: Every day | ORAL | Status: DC

## 2021-12-25 MED ORDER — ONDANSETRON HCL 4 MG PO TABS: 4.0000 mg | ORAL_TABLET | ORAL | Status: DC | PRN

## 2021-12-25 MED ORDER — CALCIUM CARBONATE ANTACID 500 MG PO CHEW
1.0000 | CHEWABLE_TABLET | ORAL | Status: DC | PRN
  Administered 2021-12-25: 200 mg via ORAL
  Filled 2021-12-25: qty 1

## 2021-12-25 MED ORDER — FUROSEMIDE 20 MG PO TABS: 20.0000 mg | ORAL_TABLET | Freq: Every day | ORAL | Status: DC

## 2021-12-25 MED ORDER — METFORMIN HCL 500 MG PO TABS
500.0000 mg | ORAL_TABLET | Freq: Two times a day (BID) | ORAL | Status: DC
  Administered 2021-12-25 – 2021-12-27 (×4): 500 mg via ORAL
  Filled 2021-12-25 (×4): qty 1

## 2021-12-25 MED ORDER — LIDOCAINE HCL (PF) 1 % IJ SOLN
INTRAMUSCULAR | Status: DC | PRN
  Administered 2021-12-25: 9 mL via EPIDURAL

## 2021-12-25 MED ORDER — INSULIN GLARGINE-YFGN 100 UNIT/ML ~~LOC~~ SOLN
10.0000 [IU] | Freq: Every day | SUBCUTANEOUS | Status: DC
  Administered 2021-12-25 – 2021-12-26 (×2): 10 [IU] via SUBCUTANEOUS
  Filled 2021-12-25 (×3): qty 0.1

## 2021-12-25 MED ORDER — SIMETHICONE 80 MG PO CHEW
80.0000 mg | CHEWABLE_TABLET | ORAL | Status: DC | PRN
  Administered 2021-12-26: 80 mg via ORAL
  Filled 2021-12-25: qty 1

## 2021-12-25 MED ORDER — ZOLPIDEM TARTRATE 5 MG PO TABS: 5.0000 mg | ORAL_TABLET | Freq: Every evening | ORAL | Status: DC | PRN

## 2021-12-25 MED ORDER — SENNOSIDES-DOCUSATE SODIUM 8.6-50 MG PO TABS
2.0000 | ORAL_TABLET | Freq: Every day | ORAL | Status: DC
  Administered 2021-12-26 – 2021-12-27 (×2): 2 via ORAL
  Filled 2021-12-25 (×2): qty 2

## 2021-12-25 MED ORDER — PRENATAL MULTIVITAMIN CH
1.0000 | ORAL_TABLET | Freq: Every day | ORAL | Status: DC
  Administered 2021-12-25 – 2021-12-26 (×2): 1 via ORAL
  Filled 2021-12-25 (×2): qty 1

## 2021-12-25 MED ORDER — ONDANSETRON HCL 4 MG/2ML IJ SOLN: 4.0000 mg | INTRAMUSCULAR | Status: DC | PRN

## 2021-12-25 NOTE — Progress Notes (Signed)
Labor Progress Note ?Sherri Lopez is a 21 y.o. G1P0000 at 73w1dpresented for IOL due to cHTN with SIPE w/o SF and T2DM on insulin.  ? ?S: Just got epidural and due to check after catheter placement. Possible subtle late decelerations on toco for the past 10 minutes, but difficulty seeing contractions.  ? ?O:  ?BP (!) 145/82   Pulse (!) 127   Temp 98.2 ?F (36.8 ?C) (Oral)   Resp 18   Ht '5\' 2"'$  (1.575 m)   Wt 86.9 kg   LMP 03/27/2021 (Exact Date)   SpO2 100%   BMI 35.04 kg/m?  ?EFM: 130/mod/none/lates  ? ?CVE: Dilation: 5 ?Effacement (%): 80 ?Station: -2 ?Presentation: Vertex ?Exam by:: Dr BHiginio Plan? ? ?A&P: 21y.o. G1P0000 343w1d?#Labor: Dilation unchanged, but has effaced with some improved station since last check about 5 hours ago with AROM at that time. After verbal consent, placed IUPC and placed onto to her left lateral side. Pit currently at 6, stopped to allow recovery. BP around baseline for admit.  ?#Pain: Epidural just placed ?#FWB: Cat II> see above, with moderate variability  ?#GBS positive ? ?#T2DM: CBGs appropriate, cont SSI as needed.  ? ?#cHTN w/ SIPE: BP mild range-- but close to severe, hopefully will improve with epidural. No symptoms. Cont procardia BID. Recent labs at 2140 reassuring.  ? ?SaPatriciaann ClanDO ?12:57 AM  ?

## 2021-12-25 NOTE — Anesthesia Postprocedure Evaluation (Signed)
Anesthesia Post Note ? ?Patient: Del Wiseman ? ?Procedure(s) Performed: AN AD HOC LABOR EPIDURAL ? ?  ? ?Patient location during evaluation: Mother Baby ?Anesthesia Type: Epidural ?Level of consciousness: awake ?Pain management: satisfactory to patient ?Vital Signs Assessment: post-procedure vital signs reviewed and stable ?Respiratory status: spontaneous breathing ?Cardiovascular status: stable ?Anesthetic complications: no ? ? ?No notable events documented. ? ?Last Vitals:  ?Vitals:  ? 12/25/21 1247 12/25/21 1355  ?BP: (!) 150/80 (!) 142/91  ?Pulse: (!) 105 98  ?Resp: 18 18  ?Temp: 37.2 ?C 37.1 ?C  ?SpO2:  100%  ?  ?Last Pain:  ?Vitals:  ? 12/25/21 1355  ?TempSrc: Oral  ?PainSc: 0-No pain  ? ?Pain Goal:   ? ?  ?  ?  ?  ?  ?  ?Epidural/Spinal Function Cutaneous sensation: Normal sensation (12/25/21 1355), Patient able to flex knees: Yes (12/25/21 1355), Patient able to lift hips off bed: Yes (12/25/21 1355), Back pain beyond tenderness at insertion site: No (12/25/21 1355), Progressively worsening motor and/or sensory loss: No (12/25/21 1355) ? ?Daivik Overley ? ? ? ? ?

## 2021-12-25 NOTE — Progress Notes (Signed)
B/P 150/80, no c/o PIH S&S. Notified Dr. Nelda Marseille. See orders. ?

## 2021-12-25 NOTE — Progress Notes (Signed)
B/P 154/87 and 149/87. No PIH S&S. Notified Dr. Adah Salvage. ?

## 2021-12-25 NOTE — Anesthesia Preprocedure Evaluation (Signed)
Anesthesia Evaluation  ?Patient identified by MRN, date of birth, ID band ?Patient awake ? ? ? ?Reviewed: ?Allergy & Precautions, H&P , Patient's Chart, lab work & pertinent test results ? ?Airway ?Mallampati: II ? ?TM Distance: >3 FB ?Neck ROM: full ? ? ? Dental ?no notable dental hx. ? ?  ?Pulmonary ? ?  ?Pulmonary exam normal ? ? ? ? ? ? ? Cardiovascular ?hypertension, Pt. on medications ?Normal cardiovascular exam ? ? ?  ?Neuro/Psych ?negative neurological ROS ? negative psych ROS  ? GI/Hepatic ?negative GI ROS, Neg liver ROS,   ?Endo/Other  ?negative endocrine ROSdiabetes, Type 2, Oral Hypoglycemic Agents, Insulin Dependent ? Renal/GU ?negative Renal ROS  ?negative genitourinary ?  ?Musculoskeletal ?negative musculoskeletal ROS ?(+)  ? Abdominal ?(+) + obese,   ?Peds ? Hematology ?negative hematology ROS ?(+)   ?Anesthesia Other Findings ? ? Reproductive/Obstetrics ?(+) Pregnancy ? ?  ? ? ? ? ? ? ? ? ? ? ? ? ? ?  ?  ? ? ? ? ? ? ? ? ?Anesthesia Physical ?Anesthesia Plan ? ?ASA: 3 ? ?Anesthesia Plan: Epidural  ? ?Post-op Pain Management:   ? ?Induction:  ? ?PONV Risk Score and Plan:  ? ?Airway Management Planned:  ? ?Additional Equipment:  ? ?Intra-op Plan:  ? ?Post-operative Plan:  ? ?Informed Consent: I have reviewed the patients History and Physical, chart, labs and discussed the procedure including the risks, benefits and alternatives for the proposed anesthesia with the patient or authorized representative who has indicated his/her understanding and acceptance.  ? ? ? ? ? ?Plan Discussed with:  ? ?Anesthesia Plan Comments:   ? ? ? ? ? ? ?Anesthesia Quick Evaluation ? ?

## 2021-12-25 NOTE — Progress Notes (Signed)
Blood Sugar: 128 @ 1071 ?

## 2021-12-25 NOTE — Progress Notes (Signed)
Patient informed to call before eating her dinner so her blood glucose could be checked by the RN. Patient ate but didn't call for her blood sugar to be checked. Glucophage 500 mg PO given as ordered. Reminded patient to please call before eating. ?

## 2021-12-25 NOTE — Lactation Note (Signed)
This note was copied from a baby's chart. ?Lactation Consultation Note ? ?Patient Name: Sherri Lopez ?Today's Date: 12/25/2021 ?Reason for consult: Initial assessment;Early term 37-38.6wks;Primapara;1st time breastfeeding;Infant < 6lbs;Maternal endocrine disorder ?Age:22 hours ? ?Initial visit to 4 hours old infant of a P1 mother. Demonstrated hand expression, collected ~0.67m and fingerfed. Provided hand pump for colostrum expression, collected ~2-mL and spoonfed. Infant is sleepy and uninterested.  ?Discussed normal newborn behavior and patterns, signs of good milk transfer, hunger cues, tummy size and benefits of skin to skin.  ?Mother agrees to formula feed at this time to improve chances of passing sugar level test.  ? ?Plan: ?1-Feeding on demand or 8-12 times in 24h period. ?2-Use manual pump as needed ?3-Encouraged maternal rest, hydration and food intake.  ? ?Contact LC as needed for feeds/support/concerns/questions. All questions answered at this time. Provided Lactation services brochure and promoted [employer] booklet information.    ? ?Maternal Data ?Has patient been taught Hand Expression?: Yes ?Does the patient have breastfeeding experience prior to this delivery?: No ? ?Feeding ?Mother's Current Feeding Choice: Breast Milk and Formula ?Nipple Type: Slow - flow ? ?LATCH Score ?Latch: Too sleepy or reluctant, no latch achieved, no sucking elicited. ? ?Audible Swallowing: None ? ?Type of Nipple: Flat ? ?Comfort (Breast/Nipple): Soft / non-tender ? ?Hold (Positioning): Full assist, staff holds infant at breast ? ?LATCH Score: 3 ? ? ?Lactation Tools Discussed/Used ?Tools: Pump;Flanges ?Flange Size: 21 ?Breast pump type: Manual ?Pump Education: Setup, frequency, and cleaning;Milk Storage ?Reason for Pumping: stimulation, supplementation, nipple eversion ?Pumping frequency: as needed ?Pumped volume: 2 mL ? ?Interventions ?Interventions: Breast feeding basics reviewed;Skin to skin;Hand express;Hand  pump;Expressed milk;Education;LC Services brochure ? ?Discharge ?Pump: Manual;Personal ? ?Consult Status ?Consult Status: Follow-up ?Date: 12/26/21 ?Follow-up type: In-patient ? ? ? ?Demarie Hyneman A Higuera Ancidey ?12/25/2021, 2:59 PM ? ? ? ?

## 2021-12-25 NOTE — Progress Notes (Signed)
Blood sugar. 115 @ 0739 ?

## 2021-12-25 NOTE — Discharge Summary (Signed)
? ?  Postpartum Discharge Summary ? ?   ?Patient Name: Sherri Lopez ?DOB: 05/24/2001 ?MRN: [id number] ? ?Date of admission: 12/24/2021 ?Delivery date:12/25/2021  ?Delivering provider: DAS, ANUKA  ?Date of discharge: 12/27/2021 ? ?Admitting diagnosis: Gestational diabetes mellitus (GDM) [O24.419] ?Intrauterine pregnancy: [redacted]w[redacted]d     ?Secondary diagnosis:  Active Problems: ?  Type 2 diabetes mellitus (HCC) ?  Supervision of high risk pregnancy, antepartum ?  Chronic hypertension during pregnancy, antepartum ?  GBS bacteriuria ?  Alpha thalassemia silent carrier ?  Vaginal delivery ?  Chronic hypertension with superimposed pre-eclampsia ? ?Additional problems: None    ?Discharge diagnosis: Term Pregnancy Delivered, CHTN with superimposed preeclampsia, and Type 2 DM                                              ?Post partum procedures: None ?Augmentation: AROM, Pitocin, Cytotec, and IP Foley ?Complications: None ? ?Hospital course: Induction of Labor With Vaginal Delivery   ?21 y.o. yo G1P0000 at [redacted]w[redacted]d was admitted to the hospital 12/24/2021 for induction of labor.  Indication for induction:  cHTN with SIPE (without severe features) and uncontrolled Type 2 DM .  Patient had an uncomplicated labor course as follows: ?Membrane Rupture Time/Date: 7:03 PM ,12/24/2021   ?Delivery Method:Vaginal, Spontaneous  ?Episiotomy: None  ?Lacerations:  Periurethral;Labial  ?Details of delivery can be found in separate delivery note.  Patient 's procardia was uptitrated to 60mg BID in post partum period and she was given a 5 day course of Lasix 20mg Daily. Patient is discharged home 12/27/21. ? ?Of note, patient was seen by diabetes coordinator while inpatient. After delivery she was started on 10U of long acting insulin and mealtime SSI while inpatient. Patient would like to continue on Omnipod for insulin administration at home. After discussion with patient and diabetes coordinator the plan was made to decrease her omnipod insulin dose by  1/3 (to mirror equivalent of pre-pregnancy insulin). She was discharged with plan for Basal-0.43 units/hr (total basal of 10.32 in 24 hrs) and  CHO ratio-1 unit:10 CHO's. ? ? ?Newborn Data: ?Birth date:12/25/2021  ?Birth time:10:41 AM  ?Gender:Female  ?Living status:Living  ?Apgars:8 ,9  ?Weight:2470 g  ? ?Magnesium Sulfate received: No ?BMZ received: No ?Rhophylac:N/A ?MMR:N/A ?T-DaP:Given prenatally ?Flu: No ?Transfusion:No ? ?Physical exam  ?Vitals:  ? 12/26/21 1434 12/26/21 1505 12/26/21 2059 12/27/21 0544  ?BP: 137/77 133/77 133/88 129/70  ?Pulse: 85 81 79 96  ?Resp: 20  18 15  ?Temp: 98.2 ?F (36.8 ?C)  98.4 ?F (36.9 ?C) 98.2 ?F (36.8 ?C)  ?TempSrc: Oral  Oral Oral  ?SpO2: 100%   99%  ?Weight:      ?Height:      ? ?General: alert ?Lochia: appropriate ?Uterine Fundus: firm ?Incision: N/A ?DVT Evaluation: No evidence of DVT seen on physical exam. ?Labs: ?Lab Results  ?Component Value Date  ? WBC 22.8 (H) 12/26/2021  ? HGB 10.7 (L) 12/26/2021  ? HCT 32.0 (L) 12/26/2021  ? MCV 74.2 (L) 12/26/2021  ? PLT 364 12/26/2021  ? ? ?  Latest Ref Rng & Units 12/26/2021  ?  5:14 AM  ?CMP  ?Glucose 70 - 99 mg/dL 161    ? ?Edinburgh Score: ? ?  12/26/2021  ?  6:17 AM  ?Edinburgh Postnatal Depression Scale Screening Tool  ?I have been able to laugh and see the funny side of   things. 0  ?I have looked forward with enjoyment to things. 0  ?I have blamed myself unnecessarily when things went wrong. 0  ?I have been anxious or worried for no good reason. 0  ?I have felt scared or panicky for no good reason. 0  ?Things have been getting on top of me. 0  ?I have been so unhappy that I have had difficulty sleeping. 0  ?I have felt sad or miserable. 0  ?I have been so unhappy that I have been crying. 0  ?The thought of harming myself has occurred to me. 0  ?Edinburgh Postnatal Depression Scale Total 0  ? ? ? ?After visit meds:  ?Allergies as of 12/27/2021   ? ?   Reactions  ? Penicillins Rash, Other (See Comments)  ? Has patient had a PCN  reaction causing immediate rash, facial/tongue/throat swelling, SOB or lightheadedness with hypotension: No ?Has patient had a PCN reaction causing severe rash involving mucus membranes or skin necrosis: No ?Has patient had a PCN reaction that required hospitalization No ?Has patient had a PCN reaction occurring within the last 10 years: No ?If all of the above answers are "NO", then may proceed with Cephalosporin use.  ? ?  ? ?  ?Medication List  ?  ? ?STOP taking these medications   ? ?aspirin EC 81 MG tablet ?  ?INS SYRINGE/NEEDLE .5CC/27G 27G X 1/2" 0.5 ML Misc ?  ?insulin degludec 100 UNIT/ML FlexTouch Pen ?Commonly known as: Tresiba FlexTouch ?  ?insulin NPH Human 100 UNIT/ML injection ?Commonly known as: HumuLIN N ?  ? ?  ? ?TAKE these medications   ? ?Accu-Chek FastClix Lancets Misc ?1 each by Does not apply route as directed. Check sugar 6 x daily ?  ?acetaminophen 325 MG tablet ?Commonly known as: Tylenol ?Take 2 tablets (650 mg total) by mouth every 4 (four) hours as needed (for pain scale < 4). ?  ?BD Pen Needle Nano U/F 32G X 4 MM Misc ?Generic drug: Insulin Pen Needle ?USE TO INJECT INSULIN VIA INSULIN PEN 6 TIMES DAILY ?  ?Dexcom G6 Transmitter Misc ?1 Device by Does not apply route as needed. ?  ?FreeStyle Libre 2 Reader Devi ?  ?FreeStyle Libre 3 Sensor Misc ?1 each by Does not apply route every 14 (fourteen) days. Place 1 sensor on the skin every 14 days. Use to check glucose continuously ?  ?furosemide 20 MG tablet ?Commonly known as: LASIX ?Take 1 tablet (20 mg total) by mouth daily for 2 days. ?  ?glucagon 1 MG injection ?Use for Severe Hypoglycemia . Inject 1 mg intramuscularly if unresponsive, unable to swallow, unconscious and/or has seizure ?  ?glucose blood test strip ?Use as instructed ?  ?ibuprofen 600 MG tablet ?Commonly known as: ADVIL ?Take 1 tablet (600 mg total) by mouth every 6 (six) hours. ?  ?insulin lispro 100 UNIT/ML injection ?Commonly known as: HumaLOG ?For continuous infusion  in Omnipod. To be adjusted per Provider. Currently on 10.32 (0.43/hr) units daily plus priming. CHO ratio-1 unit: 10 CHO's ?What changed: additional instructions ?  ?metFORMIN 750 MG 24 hr tablet ?Commonly known as: Glucophage XR ?Take 1 tablet (750 mg total) by mouth 2 (two) times daily. Take 1 tablet twice daily ?  ?NIFEdipine 60 MG 24 hr tablet ?Commonly known as: ADALAT CC ?Take 1 tablet (60 mg total) by mouth 2 (two) times daily. ?What changed:  ?medication strength ?how much to take ?when to take this ?  ?norethindrone 0.35 MG tablet ?Commonly   known as: Ortho Micronor ?Take 1 tablet (0.35 mg total) by mouth daily. ?  ?Omnipod 5 G6 Pod (Gen 5) Misc ?Inject 1 Device into the skin every other day. ?  ?prenatal vitamin w/FE, FA 29-1 MG Chew chewable tablet ?Chew 1 tablet by mouth daily at 12 noon. ?  ?Vitamin D (Ergocalciferol) 1.25 MG (50000 UNIT) Caps capsule ?Commonly known as: DRISDOL ?TAKE 1 CAPSULE (50,000 UNITS TOTAL) BY MOUTH EVERY 7 (SEVEN) DAYS. ?  ? ?  ? ? ?Discharge home in stable condition ?Infant Feeding: Bottle and Breast ?Infant Disposition:home with mother ?Discharge instruction: per After Visit Summary and Postpartum booklet. ?Activity: Advance as tolerated. Pelvic rest for 6 weeks.  ?Diet: routine diet ?Future Appointments: ?Future Appointments  ?Date Time Provider [place]  ?01/03/2022 10:00 AM [facility] NURSE [facility]  ?02/24/2022 11:15 AM Donnamae Jude, MD [facility]  ? ?Follow up Visit: ?Message sent to [facility] by Dr. Cy Blamer on 5/13 ? ?Please schedule this patient for a In person postpartum visit in 4 weeks with the following provider: MD. ?Additional Postpartum F/U:BP check 1 week  ?High risk pregnancy complicated by:  cHTN with superimposed pre-eclampsia (no SF)and T2DM ?Delivery mode:  Vaginal, Spontaneous  ?Anticipated Birth Control:  POPs ? ?Renard Matter, MD, MPH ?OB Fellow, Faculty Practice ? ? ? ?

## 2021-12-25 NOTE — Anesthesia Procedure Notes (Signed)
Epidural ?Patient location during procedure: OB ?Start time: 12/25/2021 12:15 AM ?End time: 12/25/2021 12:20 AM ? ?Staffing ?Anesthesiologist: Lyn Hollingshead, MD ? ?Preanesthetic Checklist ?Completed: patient identified, IV checked, site marked, risks and benefits discussed, surgical consent, monitors and equipment checked, pre-op evaluation and timeout performed ? ?Epidural ?Patient position: sitting ?Prep: DuraPrep and site prepped and draped ?Patient monitoring: continuous pulse ox and blood pressure ?Approach: midline ?Location: L3-L4 ?Injection technique: LOR air ? ?Needle:  ?Needle type: Tuohy  ?Needle gauge: 17 G ?Needle length: 9 cm and 9 ?Needle insertion depth: 9 cm ?Catheter type: closed end flexible ?Catheter size: 19 Gauge ?Catheter at skin depth: 14 cm ?Test dose: negative and Other ? ?Assessment ?Events: blood not aspirated, injection not painful, no injection resistance, no paresthesia and negative IV test ? ?Additional Notes ?Reason for block:procedure for pain ? ? ? ?

## 2021-12-25 NOTE — Discharge Instructions (Addendum)
For your blood pressures. Please call or come in if your blood pressure is above 150(top number) or above 100(bottom number) even after you recheck it after sitting down for 15 minutes. Also seek care if you have any headaches that won't go away with pain medications,  see spots in eyes or have blurry vision.  ? ?For your omnipod change your basal insulin rate to 0.43/hr and then with meals take 1:10 based on the number of carbohydrates (as you were doing before) ?

## 2021-12-26 LAB — CBC
HCT: 32 % — ABNORMAL LOW (ref 36.0–46.0)
Hemoglobin: 10.7 g/dL — ABNORMAL LOW (ref 12.0–15.0)
MCH: 24.8 pg — ABNORMAL LOW (ref 26.0–34.0)
MCHC: 33.4 g/dL (ref 30.0–36.0)
MCV: 74.2 fL — ABNORMAL LOW (ref 80.0–100.0)
Platelets: 364 10*3/uL (ref 150–400)
RBC: 4.31 MIL/uL (ref 3.87–5.11)
RDW: 15 % (ref 11.5–15.5)
WBC: 22.8 10*3/uL — ABNORMAL HIGH (ref 4.0–10.5)
nRBC: 0 % (ref 0.0–0.2)

## 2021-12-26 LAB — GLUCOSE, CAPILLARY
Glucose-Capillary: 152 mg/dL — ABNORMAL HIGH (ref 70–99)
Glucose-Capillary: 103 mg/dL — ABNORMAL HIGH (ref 70–99)
Glucose-Capillary: 144 mg/dL — ABNORMAL HIGH (ref 70–99)
Glucose-Capillary: 132 mg/dL — ABNORMAL HIGH (ref 70–99)

## 2021-12-26 LAB — GLUCOSE, RANDOM: Glucose, Bld: 161 mg/dL — ABNORMAL HIGH (ref 70–99)

## 2021-12-26 NOTE — Lactation Note (Addendum)
This note was copied from a baby's chart. ?Lactation Consultation Note ? ?Patient Name: Sherri Lopez ?Today's Date: 12/26/2021 ?Reason for consult: Follow-up assessment;Mother's request;Difficult latch;Early term 37-38.6wks;Infant < 6lbs;Exclusive pumping and bottle feeding ?Age:22 hours ? ?Infant cueing on arrival. Vaiden asked Mom wanted to latch baby, she declined for now stating infant prefers the bottle. Mom is pumping and offering colostrum on spoon. ? ?Plan 1. To feed based on cues 8-12x 24hr period.  ?2. Mom to offer EBM first followed by formula. LPTI guidelines reviewed, Mom aware if infant not latching she can offer more as tolerated.  ?3. DEBP q 3hrs for 15 min ( mom denied any pain with pumping) ?All questions answered at the end of the visit.  ? ?Maternal Data ?Has patient been taught Hand Expression?: Yes ? ?Feeding ?Mother's Current Feeding Choice: Breast Milk and Formula ?Nipple Type: Nfant Standard Flow (white) ? ?LATCH Score ?  ? ?  ? ?  ? ?  ? ?  ? ?  ? ? ?Lactation Tools Discussed/Used ?Tools: Pump;Flanges ?Flange Size: 21 ?Breast pump type: Double-Electric Breast Pump ?Reason for Pumping: increase stimulation ?Pumping frequency: every 3 hrs for 15 min ? ?Interventions ?Interventions: Breast feeding basics reviewed;Hand express;Expressed milk;DEBP;Education;LC Services brochure;LPT handout/interventions ? ?Discharge ?Pump: DEBP ? ?Consult Status ?Consult Status: Follow-up ?Date: 12/27/21 ?Follow-up type: In-patient ? ? ? ?Ketih Goodie  Nicholson-Springer ?12/26/2021, 5:59 PM ? ? ? ?

## 2021-12-26 NOTE — Progress Notes (Signed)
POSTPARTUM PROGRESS NOTE ? ?Subjective: Sherri Lopez is a 20 y.o. G1P0000 PPD#1 s/p SVD at 67w2dafter IOL for cHTN with SIPE without SF.  She reports she doing well. No acute events overnight. She denies any problems with ambulating, voiding or po intake. Denies nausea or vomiting. She has  passed flatus. Pain is well controlled.  Lochia is scant. ? ?Objective: ?Blood pressure 129/72, pulse 93, temperature 97.8 ?F (36.6 ?C), temperature source Oral, resp. rate 20, height '5\' 2"'$  (1.575 m), weight 86.9 kg, last menstrual period 03/27/2021, SpO2 99 %. ? ?Physical Exam:  ?General: alert, cooperative and no distress ?Chest: no respiratory distress ?Abdomen: soft, non-tender  ?Uterine Fundus: firm, appropriately tender ?Extremities: No calf swelling or tenderness  mild edema ? ?Recent Labs  ?  12/25/21 ?1245 12/26/21 ?02774 ?HGB 11.9* 10.7*  ?HCT 35.3* 32.0*  ? ? ?Assessment/Plan: ?Sherri Lopez a 21y.o. G1P0000 PPD#1 s/p SVD at 358w2dfter IOL for cHTN with SIPE without SF ? ?Routine Postpartum Care: Doing well, pain well-controlled.  ?-- Continue routine care, lactation support  ?-- Contraception: POPs ?-- Feeding: breat and bottle ? ?#cHTN with SIPE without SF ?Patient on procardia '60mg'$  BID and 5 day course of lasix 20 daily. BP now improved from 140s to now in 120s-130s. No symptoms. Will plan to continue '60mg'$  BID of Procardia ? ?#T2DM ?Patient with hx of T2DM and was on insulin prior to pregnancy. Also on Metformin. At home uses omnipod for insulin admin. Here getting subQ as omnipod does not have insulin in it at this time. BG early AM 150s, prior to lunch 103.  ?- continue on Semglee 10U here with mealtime SSI. ?- at DC per diabetic educator and patient's wishes continue Omnipod with Basal-0.43 units/hr (total basal of 10.32 in 24 hrs) and CHO ratio-1 unit: 10 CHO's ? ?Dispo: Plan for discharge PPD#2 as will monitor BP and symptoms to ensure does not need further titration of meds. ? ?AnRenard MatterMD, MPH ?OB  Fellow, Faculty Practice ?Center for [facility]? ?

## 2021-12-27 ENCOUNTER — Encounter: Payer: Self-pay | Admitting: Family Medicine

## 2021-12-27 ENCOUNTER — Other Ambulatory Visit (HOSPITAL_COMMUNITY): Payer: Self-pay

## 2021-12-27 LAB — GC/CHLAMYDIA PROBE AMP (~~LOC~~) NOT AT ARMC
Chlamydia: NEGATIVE
Comment: NEGATIVE
Comment: NORMAL
Neisseria Gonorrhea: NEGATIVE

## 2021-12-27 LAB — GLUCOSE, CAPILLARY
Glucose-Capillary: 119 mg/dL — ABNORMAL HIGH (ref 70–99)
Glucose-Capillary: 98 mg/dL (ref 70–99)
Glucose-Capillary: 128 mg/dL — ABNORMAL HIGH (ref 70–99)
Glucose-Capillary: 115 mg/dL — ABNORMAL HIGH (ref 70–99)
Glucose-Capillary: 101 mg/dL — ABNORMAL HIGH (ref 70–99)
Glucose-Capillary: 105 mg/dL — ABNORMAL HIGH (ref 70–99)

## 2021-12-27 MED ORDER — NORETHINDRONE 0.35 MG PO TABS
1.0000 | ORAL_TABLET | Freq: Every day | ORAL | 3 refills | Status: AC
  Filled 2021-12-27: qty 28, 28d supply, fill #0

## 2021-12-27 MED ORDER — INSULIN LISPRO 100 UNIT/ML IJ SOLN
INTRAMUSCULAR | 1 refills | Status: DC
  Filled 2021-12-27: qty 10, 30d supply, fill #0

## 2021-12-27 MED ORDER — PNEUMOCOCCAL 20-VAL CONJ VACC 0.5 ML IM SUSY: 0.5000 mL | PREFILLED_SYRINGE | INTRAMUSCULAR | Status: DC

## 2021-12-27 MED ORDER — CALCIUM CARBONATE ANTACID 500 MG PO CHEW
1.0000 | CHEWABLE_TABLET | Freq: Three times a day (TID) | ORAL | Status: DC | PRN
  Administered 2021-12-27: 200 mg via ORAL
  Filled 2021-12-27: qty 1

## 2021-12-27 MED ORDER — ACETAMINOPHEN 325 MG PO TABS
650.0000 mg | ORAL_TABLET | ORAL | 0 refills | Status: DC | PRN
  Filled 2021-12-27: qty 60, 5d supply, fill #0

## 2021-12-27 MED ORDER — PNEUMOCOCCAL 20-VAL CONJ VACC 0.5 ML IM SUSY
0.5000 mL | PREFILLED_SYRINGE | Freq: Once | INTRAMUSCULAR | Status: AC
  Administered 2021-12-27: 0.5 mL via INTRAMUSCULAR
  Filled 2021-12-27: qty 0.5

## 2021-12-27 MED ORDER — NIFEDIPINE ER 60 MG PO TB24
60.0000 mg | ORAL_TABLET | Freq: Two times a day (BID) | ORAL | 0 refills | Status: AC
  Filled 2021-12-27: qty 60, 30d supply, fill #0

## 2021-12-27 MED ORDER — IBUPROFEN 600 MG PO TABS
600.0000 mg | ORAL_TABLET | Freq: Four times a day (QID) | ORAL | 0 refills | Status: AC
  Filled 2021-12-27: qty 30, 8d supply, fill #0

## 2021-12-27 MED ORDER — FUROSEMIDE 20 MG PO TABS
20.0000 mg | ORAL_TABLET | Freq: Every day | ORAL | 0 refills | Status: DC
  Filled 2021-12-27: qty 2, 2d supply, fill #0

## 2021-12-27 NOTE — Lactation Note (Signed)
This note was copied from a baby's chart. ?Lactation Consultation Note ? ?Patient Name: Sherri Lopez ?Today's Date: 12/27/2021 ?Reason for consult: Follow-up assessment;1st time breastfeeding;Primapara;Early term 37-38.6wks;Infant < 6lbs;Maternal endocrine disorder ?Age:22 hours ? ?LC in to visit with P1 Mom of ET infant on day of discharge.  Baby is at 0% weight loss and Mom is consistently supplementing with formula by bottle.   ? ?Encouraged Mom to keep baby STS as much as possible.  Encouraged Mom to offer her breast with cues BEFORE supplementation.   ?Mom to pump if baby is supplemented after breastfeeding. ?Engorgement prevention and treatment reviewed. ?Mom aware of OP lactation support available to her.  Encouraged Mom to call prn for assistance. ? ?Message sent to [facility] RN [facility] for lactation F/U. ? ?Lactation Tools Discussed/Used ?Tools: Pump;Flanges;Bottle ?Breast pump type: Double-Electric Breast Pump;Manual ?Pump Education: Setup, frequency, and cleaning;Milk Storage ?Reason for Pumping: <6 lbs, ET infant ?Pumping frequency: Encouraged Mom to pump both breasts after every feeding when baby is supplemented ?Pumped volume: 1 mL ? ?Interventions ?Interventions: Breast feeding basics reviewed;Skin to skin;Breast massage;Hand express;Education;DEBP;Hand pump ? ?Discharge ?Discharge Education: Engorgement and breast care;Warning signs for feeding baby;Outpatient recommendation;Outpatient Epic message sent ?Pump: Personal;Manual ([place] DEBP) ? ?Consult Status ?Consult Status: Complete ?Date: 12/27/21 ?Follow-up type: Out-patient ? ? ? ?Tilda Burrow E ?12/27/2021, 11:06 AM ? ? ? ?

## 2021-12-27 NOTE — Progress Notes (Signed)
Patient wants to wait to go over DC papers till fob returns, will call RN. ?

## 2021-12-27 NOTE — Progress Notes (Signed)
[facility] for patient medication to be delivered, patient discharged. Filling order currently. ?

## 2021-12-28 LAB — BPAM RBC
Blood Product Expiration Date: 202306072359
Blood Product Expiration Date: 202306072359
Unit Type and Rh: 5100
Unit Type and Rh: 5100

## 2021-12-28 LAB — TYPE AND SCREEN
ABO/RH(D): O POS
Antibody Screen: NEGATIVE
Unit division: 0
Unit division: 0

## 2021-12-29 LAB — STREP GP B SUSCEPTIBILITY

## 2021-12-29 LAB — STREP GP B CULTURE+RFLX: Strep Gp B Culture+Rflx: POSITIVE — AB

## 2021-12-30 ENCOUNTER — Encounter: Payer: 59 | Admitting: Family Medicine

## 2021-12-30 ENCOUNTER — Other Ambulatory Visit: Payer: 59

## 2021-12-31 ENCOUNTER — Ambulatory Visit: Payer: 59

## 2022-01-03 ENCOUNTER — Telehealth (HOSPITAL_COMMUNITY): Payer: Self-pay | Admitting: *Deleted

## 2022-01-03 ENCOUNTER — Ambulatory Visit: Payer: 59

## 2022-01-03 NOTE — Telephone Encounter (Signed)
Mom reports feeling good. No concerns about herself at this time. EPDS=0 [name] score=0) Mom reports baby is doing well. Feeding, peeing, and pooping without difficulty. Safe sleep reviewed. Mom reports no concerns about baby at present.  Odis Hollingshead, RN 01-03-2022 at 3:58pm

## 2022-01-07 ENCOUNTER — Encounter: Payer: 59 | Admitting: Family Medicine

## 2022-01-18 ENCOUNTER — Encounter: Payer: 59 | Admitting: Family Medicine

## 2022-01-26 NOTE — Progress Notes (Deleted)
    [place] Partum Visit Note  Sherri Lopez is a 21 y.o. G58P0000 female who presents for a postpartum visit. She is 4 weeks postpartum following a normal spontaneous vaginal delivery.  I have fully reviewed the prenatal and intrapartum course. The delivery was at 28w1dgestational weeks.  Anesthesia: epidural. Postpartum course has been ***. Baby is doing well***. Baby is feeding by {breastmilk/bottle:69}. Bleeding {vag bleed:12292}. Bowel function is {normal:32111}. Bladder function is {normal:32111}. Patient {is/is not:9024} sexually active. Contraception method is {contraceptive method:5051}. Postpartum depression screening: {gen negative/positive:315881}.   The pregnancy intention screening data noted above was reviewed. Potential methods of contraception were discussed. The patient elected to proceed with No data recorded.    Health Maintenance Due  Topic Date Due   COVID-19 Vaccine (1) Never done   FOOT EXAM  Never done   OPHTHALMOLOGY EXAM  05/18/2010   URINE MICROALBUMIN  Never done   HPV VACCINES (1 - 2-dose series) Never done    {Common ambulatory SmartLinks:19316}  Review of Systems {ros; complete:30496}  Objective:  LMP 03/27/2021 (Exact Date)    General:  {gen appearance:16600}   Breasts:  {desc; normal/abnormal/not indicated:14647}  Lungs: {lung exam:16931}  Heart:  {heart exam:5510}  Abdomen: {abdomen exam:16834}   Wound {Wound assessment:11097}  GU exam:  {desc; normal/abnormal/not indicated:14647}       Assessment:    There are no diagnoses linked to this encounter.  *** postpartum exam.   Plan:   Essential components of care per ACOG recommendations:  1.  Mood and well being: Patient with {gen negative/positive:315881} depression screening today. Reviewed local resources for support.  - Patient tobacco use? {tobacco use:25506}  - hx of drug use? {yes/no:25505}    2. Infant care and feeding:  -Patient currently breastmilk feeding? {yes/no:25502}   -Social determinants of health (SDOH) reviewed in EPIC. No concerns***The following needs were identified***  3. Sexuality, contraception and birth spacing - Patient {DOES_DOES NUTM:54650}want a pregnancy in the next year.  Desired family size is {NUMBER 1-10:22536} children.  - Reviewed reproductive life planning. Reviewed contraceptive methods based on pt preferences and effectiveness.  Patient desired {Upstream End Methods:24109} today.   - Discussed birth spacing of 18 months  4. Sleep and fatigue -Encouraged family/partner/community support of 4 hrs of uninterrupted sleep to help with mood and fatigue  5. Physical Recovery  - Discussed patients delivery and complications. She describes her labor as {description:25511} - Patient had a {CHL AMB DELIVERY:8706427468}. Patient had a {laceration:25518} laceration. Perineal healing reviewed. Patient expressed understanding - Patient has urinary incontinence? {yes/no:25515} - Patient {ACTION; IS/IS NPTW:65681275}safe to resume physical and sexual activity  6.  Health Maintenance - HM due items addressed {Yes or If no, why not?:20788} - Last pap smear  Diagnosis  Date Value Ref Range Status  08/04/2021   Final   - Negative for intraepithelial lesion or malignancy (NILM)   Pap smear {done:10129} at today's visit.  -Breast Cancer screening indicated? {indicated:25516}  7. Chronic Disease/Pregnancy Condition follow up: {Follow up:25499}  - PCP follow up  [place]ake [place]

## 2022-01-27 ENCOUNTER — Ambulatory Visit: Payer: 59 | Admitting: Family Medicine

## 2022-02-24 ENCOUNTER — Ambulatory Visit (INDEPENDENT_AMBULATORY_CARE_PROVIDER_SITE_OTHER): Payer: 59 | Admitting: Family Medicine

## 2022-02-24 ENCOUNTER — Other Ambulatory Visit: Payer: Self-pay

## 2022-02-24 ENCOUNTER — Encounter: Payer: Self-pay | Admitting: Family Medicine

## 2022-02-24 ENCOUNTER — Ambulatory Visit: Payer: 59 | Admitting: Family Medicine

## 2022-02-24 ENCOUNTER — Encounter (HOSPITAL_COMMUNITY): Payer: Self-pay | Admitting: Obstetrics & Gynecology

## 2022-02-24 VITALS — BP 177/93 | HR 84 | Wt 181.0 lb

## 2022-02-24 DIAGNOSIS — I10 Essential (primary) hypertension: Secondary | ICD-10-CM

## 2022-02-24 DIAGNOSIS — E1100 Type 2 diabetes mellitus with hyperosmolarity without nonketotic hyperglycemic-hyperosmolar coma (NKHHC): Secondary | ICD-10-CM

## 2022-02-24 DIAGNOSIS — Z794 Long term (current) use of insulin: Secondary | ICD-10-CM

## 2022-02-24 MED ORDER — ENALAPRIL MALEATE 5 MG PO TABS: 5.0000 mg | ORAL_TABLET | Freq: Every day | ORAL | 2 refills | Status: AC

## 2022-02-24 NOTE — Progress Notes (Signed)
[place] Partum Visit Note  Sherri Lopez is a 21 y.o. G54P0000 female who presents for a postpartum visit. She is 8 weeks postpartum following a normal spontaneous vaginal delivery.  I have fully reviewed the prenatal and intrapartum course. The delivery was at 37.1 gestational weeks.  Anesthesia: epidural. Postpartum course has been. Baby is doing well but not growing. Baby is feeding by bottle - Enfamil Lipil. Bleeding no bleeding. Bowel function is normal. Bladder function is normal. Patient is sexually active. Contraception method is oral progesterone-only contraceptive. Postpartum depression screening: negative.   The pregnancy intention screening data noted above was reviewed. Potential methods of contraception were discussed. The patient elected to proceed with No data recorded.   Edinburgh Postnatal Depression Scale - 02/24/22 1127       Edinburgh Postnatal Depression Scale:  In the Past 7 Days   I have been able to laugh and see the funny side of things. 0    I have looked forward with enjoyment to things. 0    I have blamed myself unnecessarily when things went wrong. 0    I have been anxious or worried for no good reason. 0    I have felt scared or panicky for no good reason. 0    Things have been getting on top of me. 0    I have been so unhappy that I have had difficulty sleeping. 0    I have felt sad or miserable. 0    I have been so unhappy that I have been crying. 0    The thought of harming myself has occurred to me. 0    Edinburgh Postnatal Depression Scale Total 0             Health Maintenance Due  Topic Date Due   COVID-19 Vaccine (1) Never done   FOOT EXAM  Never done   OPHTHALMOLOGY EXAM  05/18/2010   URINE MICROALBUMIN  Never done   HPV VACCINES (1 - 2-dose series) Never done    The following portions of the patient's history were reviewed and updated as appropriate: allergies, current medications, past family history, past medical history, past social  history, past surgical history, and problem list.  Review of Systems Pertinent items noted in HPI and remainder of comprehensive ROS otherwise negative.  Objective:  BP (!) 177/93   Pulse 84   Wt 181 lb (82.1 kg)   LMP  (LMP Unknown)   Breastfeeding No   BMI 33.11 kg/m    General:  alert, cooperative, and appears stated age  Lungs: Normal effort  Heart:  regular rate and rhythm  Abdomen: soft, non-tender; bowel sounds normal; no masses,  no organomegaly        Assessment:    Postpartum exam.   Plan:   Essential components of care per ACOG recommendations:  1.  Mood and well being: Patient with negative depression screening today. Reviewed local resources for support.  - Patient tobacco use? No.   - hx of drug use? No.    2. Infant care and feeding:  -Patient currently breastmilk feeding? No.  -Social determinants of health (SDOH) reviewed in EPIC. No concerns  3. Sexuality, contraception and birth spacing - Patient does not want a pregnancy in the next year.  Desired family size is 2 children.  - Reviewed reproductive life planning. Reviewed contraceptive methods based on pt preferences and effectiveness.  Patient desired Oral Contraceptive today.   - Discussed birth spacing of 18 months  4. Sleep and fatigue -Encouraged family/partner/community support of 4 hrs of uninterrupted sleep to help with mood and fatigue  5. Physical Recovery  - Discussed patients delivery and complications. She describes her labor as good. - Patient had a Vaginal, no problems at delivery. Patient had a  labial  laceration. Perineal healing reviewed. Patient expressed understanding - Patient has urinary incontinence? No. - Patient is safe to resume physical and sexual activity  6.  Health Maintenance - HM due items addressed Yes - Last pap smear  Diagnosis  Date Value Ref Range Status  08/04/2021   Final   - Negative for intraepithelial lesion or malignancy (NILM)   Pap smear not  done at today's visit.  -Breast Cancer screening indicated? No.   7. Chronic Disease/Pregnancy Condition follow up: Hypertension and T2DM BP is not well controlled. Add ACE-I CMP in 1 month On metformin and insulin--check A1C next visit.   Donnamae Jude, MD Center for [employer], [place]

## 2022-03-30 ENCOUNTER — Encounter: Payer: 59 | Admitting: Family Medicine

## 2022-04-07 ENCOUNTER — Encounter: Payer: 59 | Admitting: Family Medicine

## 2022-04-07 NOTE — Progress Notes (Deleted)
   GYNECOLOGY PROBLEM  VISIT ENCOUNTER NOTE  Subjective:   Sherri Lopez is a 21 y.o. G45P1001 female here for a problem GYN visit.  Current complaints: BP check.   Denies abnormal vaginal bleeding, discharge, pelvic pain, problems with intercourse or other gynecologic concerns.    Gynecologic History No LMP recorded.  Contraception: oral progesterone-only contraceptive  Health Maintenance Due  Topic Date Due   COVID-19 Vaccine (1) Never done   FOOT EXAM  Never done   OPHTHALMOLOGY EXAM  05/18/2010   HPV VACCINES (1 - 2-dose series) Never done   INFLUENZA VACCINE  03/15/2022    The following portions of the patient's history were reviewed and updated as appropriate: allergies, current medications, past family history, past medical history, past social history, past surgical history and problem list.  Review of Systems Pertinent items are noted in HPI.   Objective:  There were no vitals taken for this visit. Gen: well appearing, NAD HEENT: no scleral icterus CV: RR Lung: Normal WOB Ext: warm well perfused   Assessment and Plan:  1. History of severe pre-eclampsia ***  2. Type 2 diabetes mellitus with hyperosmolarity without coma, with long-term current use of insulin (HCC) ***   Please refer to After Visit Summary for other counseling recommendations.   No follow-ups on file.  Caren Macadam, MD, MPH, ABFM Attending [place] for [facility]

## 2022-04-14 ENCOUNTER — Other Ambulatory Visit: Payer: Self-pay

## 2022-04-14 ENCOUNTER — Encounter: Payer: Self-pay | Admitting: Family Medicine

## 2022-04-14 ENCOUNTER — Ambulatory Visit (INDEPENDENT_AMBULATORY_CARE_PROVIDER_SITE_OTHER): Payer: 59 | Admitting: Family Medicine

## 2022-04-14 VITALS — BP 158/94 | HR 108

## 2022-04-14 DIAGNOSIS — Z794 Long term (current) use of insulin: Secondary | ICD-10-CM

## 2022-04-14 DIAGNOSIS — O10919 Unspecified pre-existing hypertension complicating pregnancy, unspecified trimester: Secondary | ICD-10-CM

## 2022-04-14 DIAGNOSIS — E1100 Type 2 diabetes mellitus with hyperosmolarity without nonketotic hyperglycemic-hyperosmolar coma (NKHHC): Secondary | ICD-10-CM

## 2022-04-14 NOTE — Progress Notes (Signed)
   GYNECOLOGY PROBLEM  VISIT ENCOUNTER NOTE  Subjective:   Sherri Lopez is a 21 y.o. G83P1001 female here for a problem GYN visit.  Current complaints: None she did run out of insulin and reports very elevated BG but have improved with getting insulin. Fastings are 130s and pp 180-200.   Denies abnormal vaginal bleeding, discharge, pelvic pain, problems with intercourse or other gynecologic concerns.    Gynecologic History No LMP recorded.  Contraception: none  Health Maintenance Due  Topic Date Due   COVID-19 Vaccine (1) Never done   FOOT EXAM  Never done   OPHTHALMOLOGY EXAM  05/18/2010   HPV VACCINES (1 - 2-dose series) Never done   INFLUENZA VACCINE  03/15/2022    The following portions of the patient's history were reviewed and updated as appropriate: allergies, current medications, past family history, past medical history, past social history, past surgical history and problem list.  Review of Systems Pertinent items are noted in HPI.   Objective:  BP (!) 158/94   Pulse (!) 108  Gen: well appearing, NAD> appears older than stated  HEENT: no scleral icterus CV: RR Lung: Normal WOB Ext: warm well perfused   Assessment and Plan:  1. Chronic hypertension during pregnancy, antepartum Poorly controlled Patient has not established PCP care  Encouraged this and will continue to follow up on 9/14 - Comprehensive metabolic panel - Protein / Creatinine Ratio, Urine  2. Type 2 diabetes mellitus with hyperosmolarity without coma, with long-term current use of insulin ([place]) Poorly controlled Get HA1C today  NEEDS to discuss reproductive life planning   Please refer to After Visit Summary for other counseling recommendations.   Return in about 2 weeks (around 04/28/2022).  Caren Macadam, MD, MPH, ABFM Attending [place] for [facility]

## 2022-04-15 LAB — COMPREHENSIVE METABOLIC PANEL
ALT: 14 IU/L (ref 0–32)
AST: 12 IU/L (ref 0–40)
Albumin/Globulin Ratio: 1.3 (ref 1.2–2.2)
Albumin: 4.3 g/dL (ref 4.0–5.0)
Alkaline Phosphatase: 110 IU/L (ref 44–121)
BUN/Creatinine Ratio: 14 (ref 9–23)
BUN: 10 mg/dL (ref 6–20)
Bilirubin Total: 0.3 mg/dL (ref 0.0–1.2)
CO2: 20 mmol/L (ref 20–29)
Calcium: 9.8 mg/dL (ref 8.7–10.2)
Chloride: 99 mmol/L (ref 96–106)
Creatinine, Ser: 0.72 mg/dL (ref 0.57–1.00)
Globulin, Total: 3.2 g/dL (ref 1.5–4.5)
Glucose: 393 mg/dL — ABNORMAL HIGH (ref 70–99)
Potassium: 4.4 mmol/L (ref 3.5–5.2)
Sodium: 134 mmol/L (ref 134–144)
Total Protein: 7.5 g/dL (ref 6.0–8.5)
eGFR: 122 mL/min/{1.73_m2} (ref >59)

## 2022-04-15 LAB — SPECIMEN STATUS REPORT

## 2022-04-17 LAB — PROTEIN / CREATININE RATIO, URINE
Creatinine, Urine: 32.3 mg/dL
Protein, Ur: 17.9 mg/dL
Protein/Creat Ratio: 554 mg/g creat — ABNORMAL HIGH (ref 0–200)

## 2022-04-19 ENCOUNTER — Encounter: Payer: Self-pay | Admitting: Family Medicine

## 2022-04-19 DIAGNOSIS — R809 Proteinuria, unspecified: Secondary | ICD-10-CM | POA: Insufficient documentation

## 2022-07-22 IMAGING — US US FETAL BPP W/ NON-STRESS
1 series · 11 of 11 positions shown · non-contrast
Comparison: none

[Series 1: us fetal bpp w/ non-stress · 11 acquisitions, 11 frames shown]
[im 1/11]
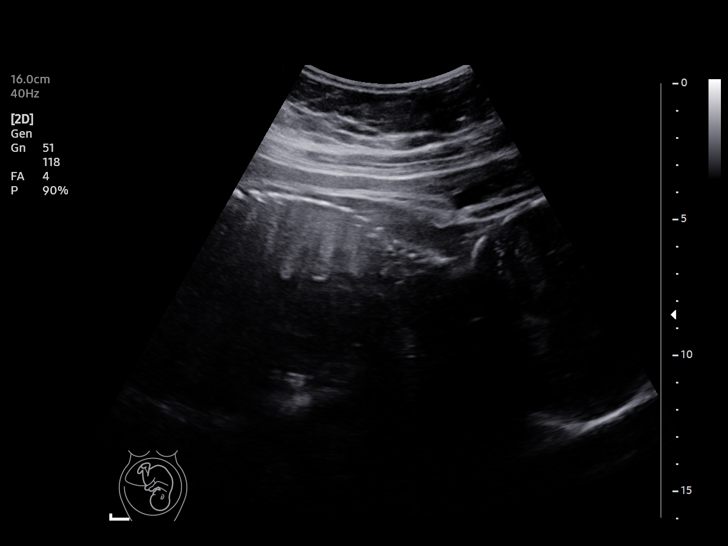
[im 2/11]
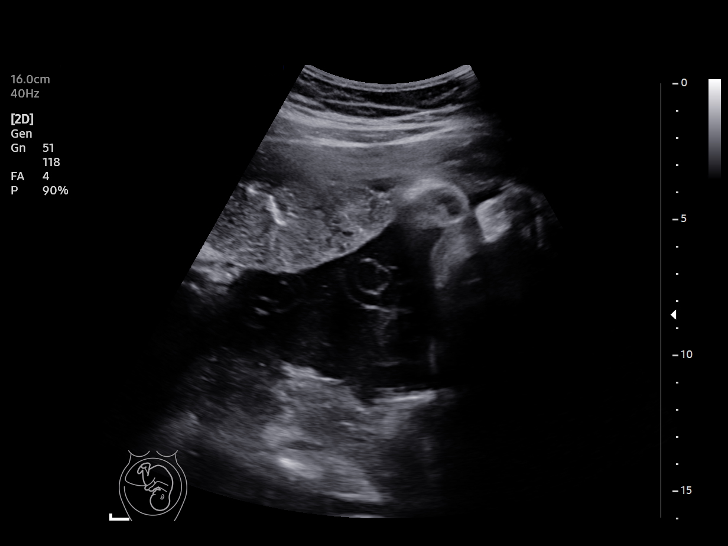
[im 3/11]
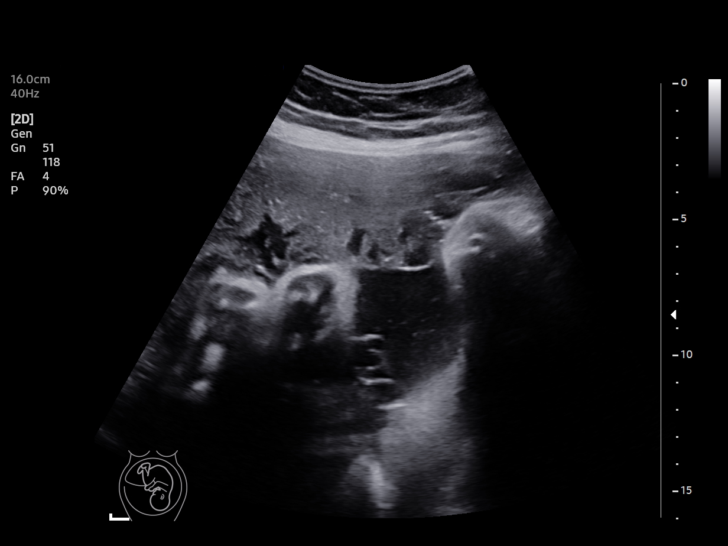
[im 4/11]
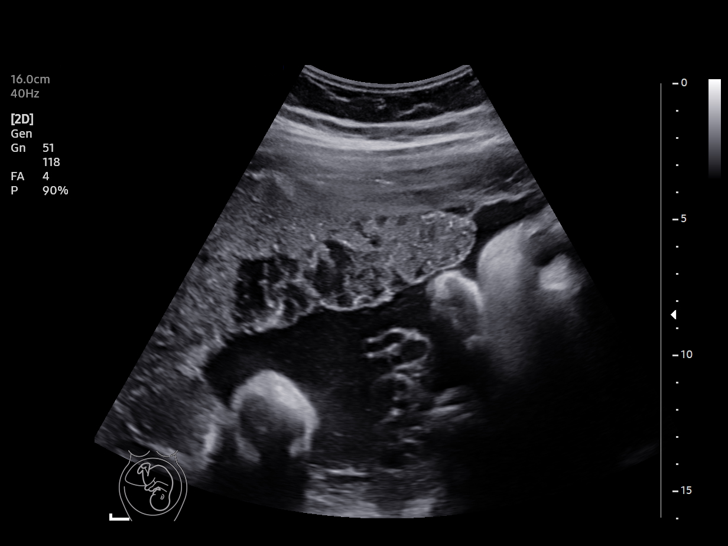
[im 5/11]
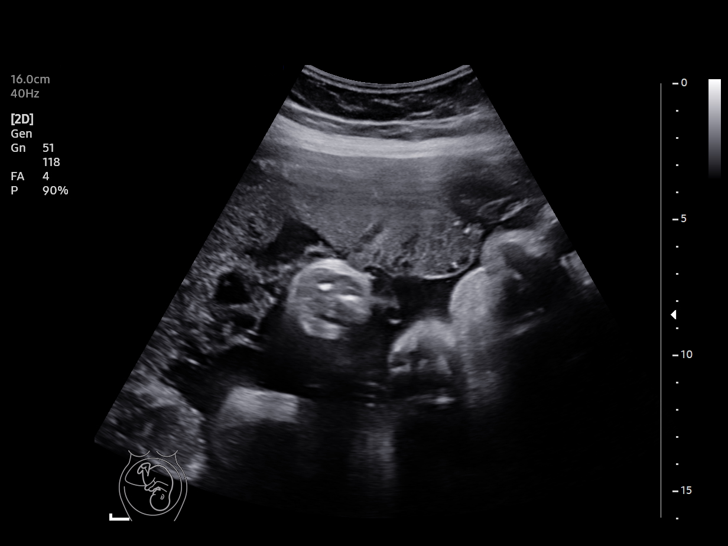
[im 6/11]
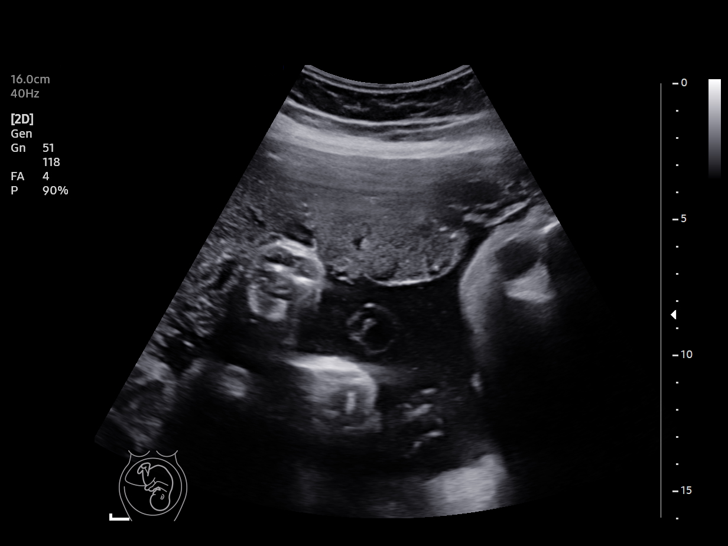
[im 7/11]
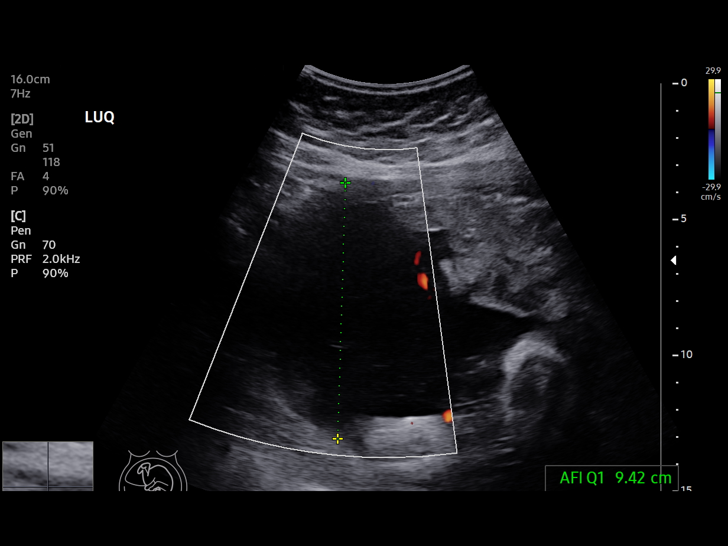
[im 8/11]
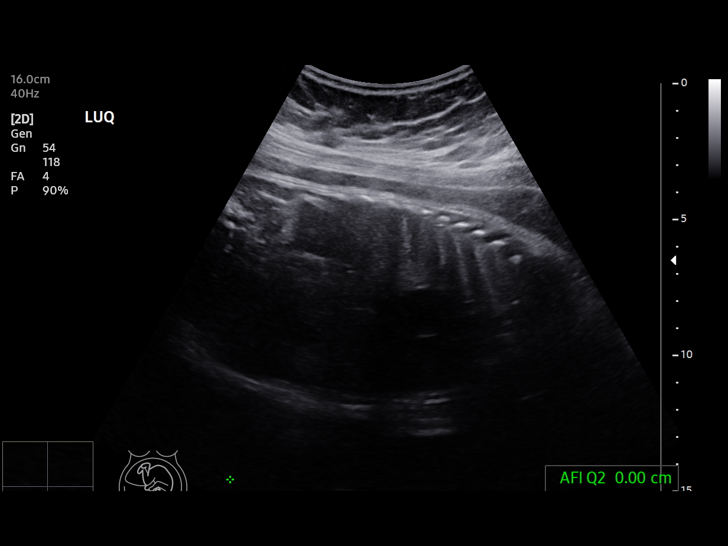
[im 9/11]
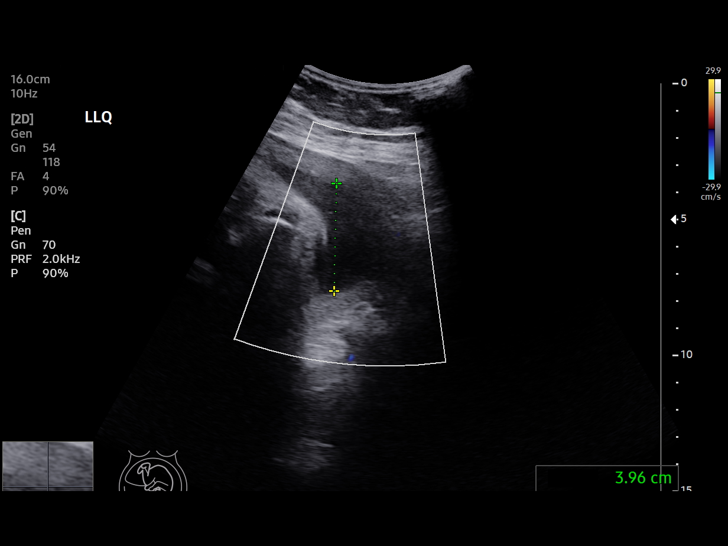
[im 10/11]
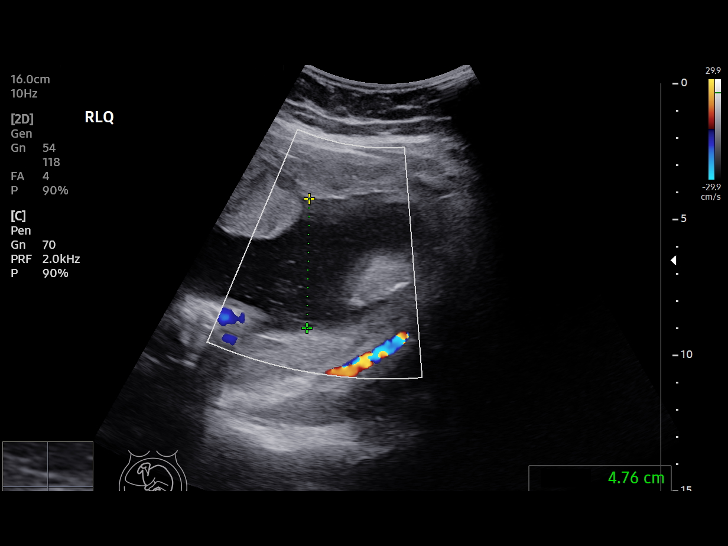
[im 11/11]
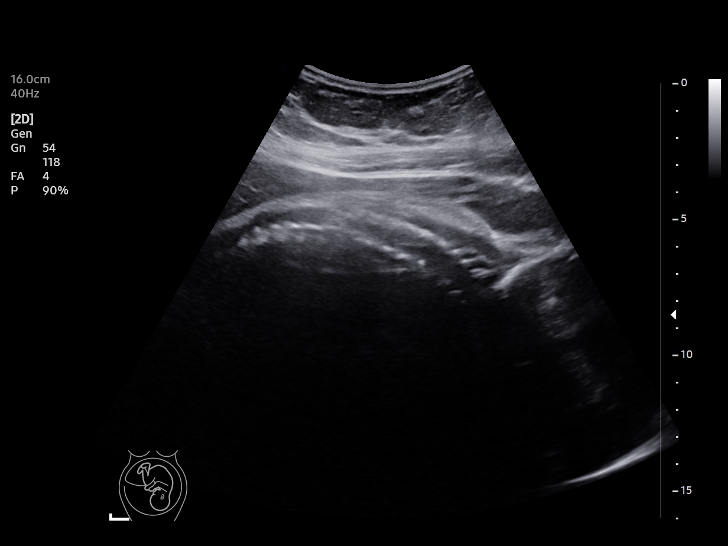

[11 of 11 positions shown; findings below may reference images not displayed]

[REDACTED]care at

 1  US FETAL BPP W/NONSTRESS              76818.4     DARLIN SIL

Service(s) Provided

Indications

 34 weeks gestation of pregnancy
 Pre-existing diabetes, type 2, in pregnancy,
 third trimester
Fetal Evaluation

 Num Of Fetuses:         1
 Preg. Location:         Intrauterine
 Cardiac Activity:       Observed
 Presentation:           Cephalic

 Amniotic Fluid
 AFI FV:      Within normal limits

 AFI Sum(cm)     %Tile       Largest Pocket(cm)
 18.14           67

 RUQ(cm)       RLQ(cm)       LUQ(cm)        LLQ(cm)
 9.42          4.76          0
Biophysical Evaluation

 Amniotic F.V:   Pocket => 2 cm             F. Tone:        Observed
 F. Movement:    Observed                   N.S.T:          Reactive
 F. Breathing:   Not Observed               Score:          [DATE]
OB History

 Gravidity:    1
 Living:       0
Gestational Age

 LMP:           36w 5d        Date:  03/27/21                  EDD:   01/01/22
 Best:          34w 6d     Det. By:  Early Ultrasound         EDD:   01/14/22
                                     (05/25/21)
Impression

 BPP [DATE] ( -2 for breathing)
 Vertex
Recommendations

 Continue with antenatal testing as clinically indicated

## 2022-08-05 IMAGING — US US FETAL BPP W/ NON-STRESS
1 series · 12 of 12 positions shown · non-contrast
Comparison: none

[Series 1: us fetal bpp w/ non-stress · 12 acquisitions, 12 frames shown]
[im 1/12]
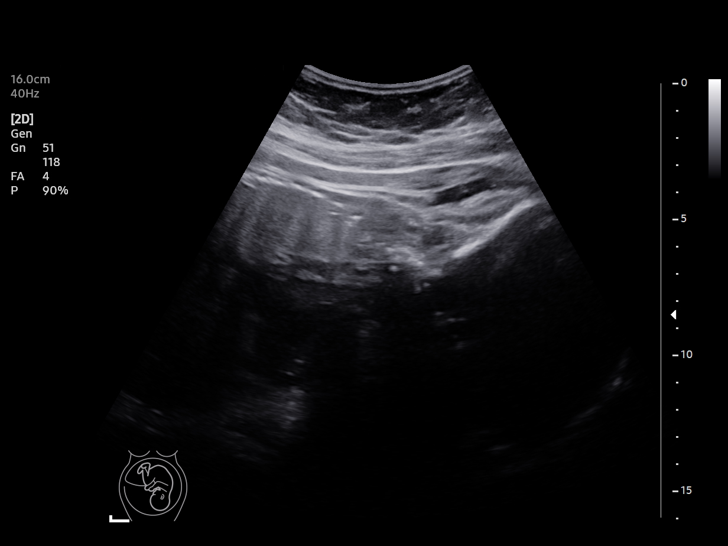
[im 2/12]
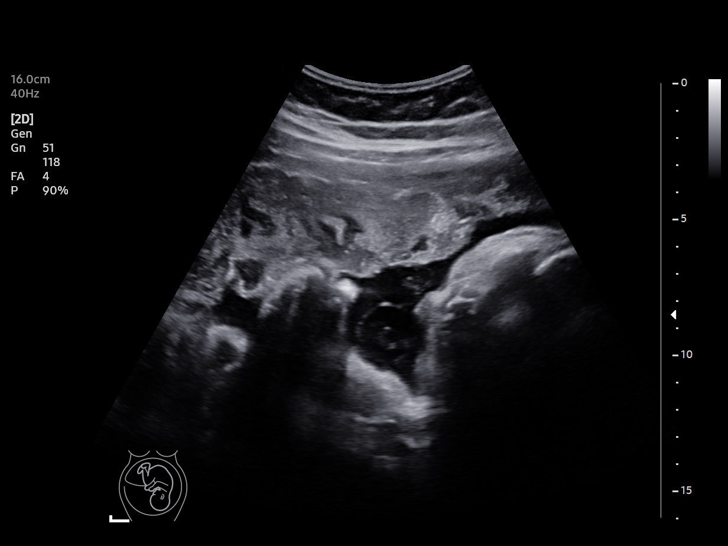
[im 3/12]
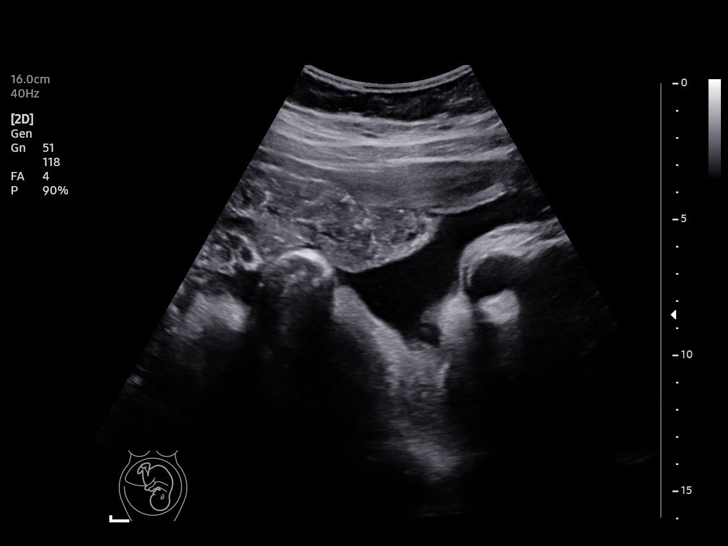
[im 4/12]
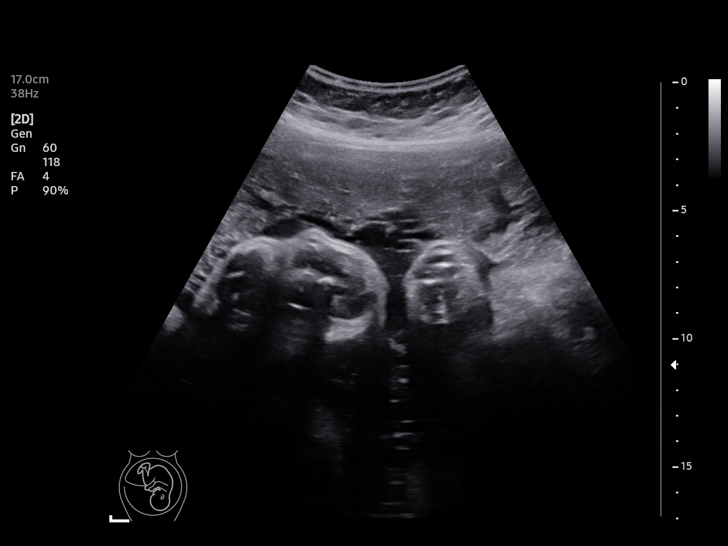
[im 5/12]
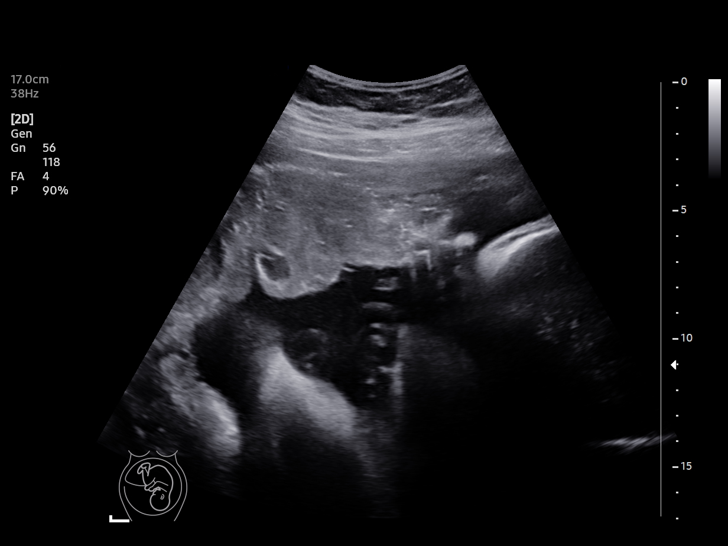
[im 6/12]
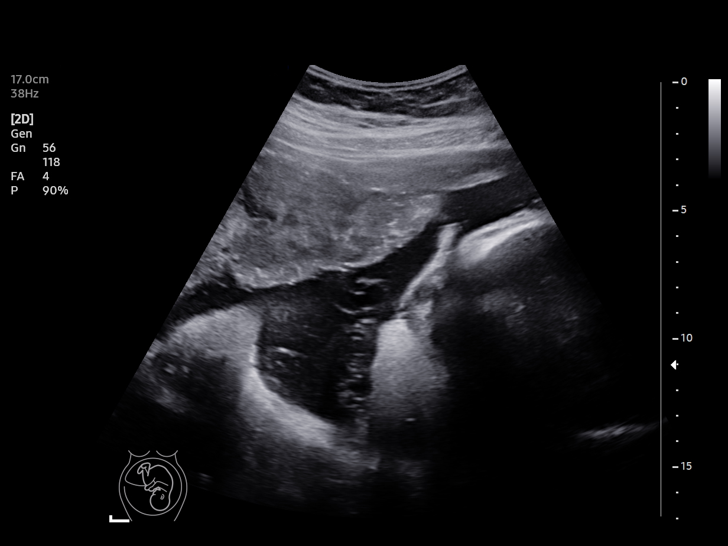
[im 7/12]
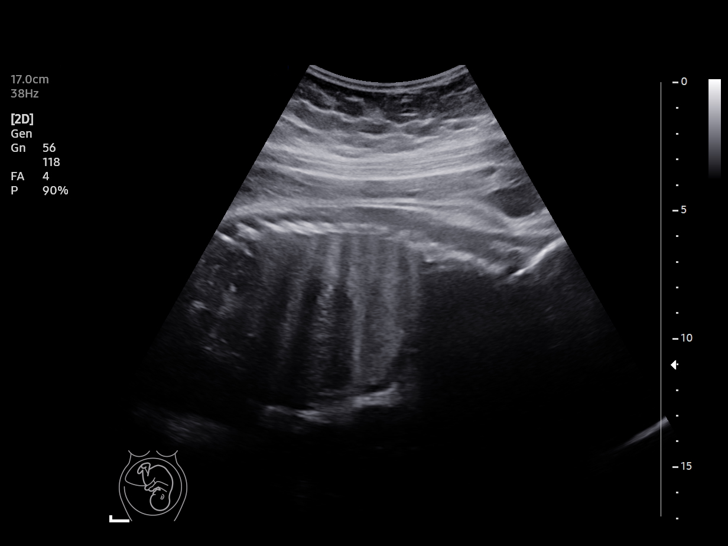
[im 8/12]
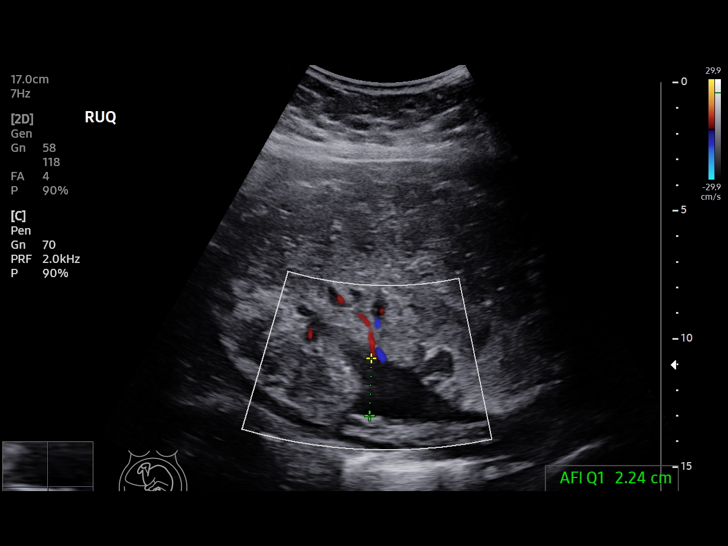
[im 9/12]
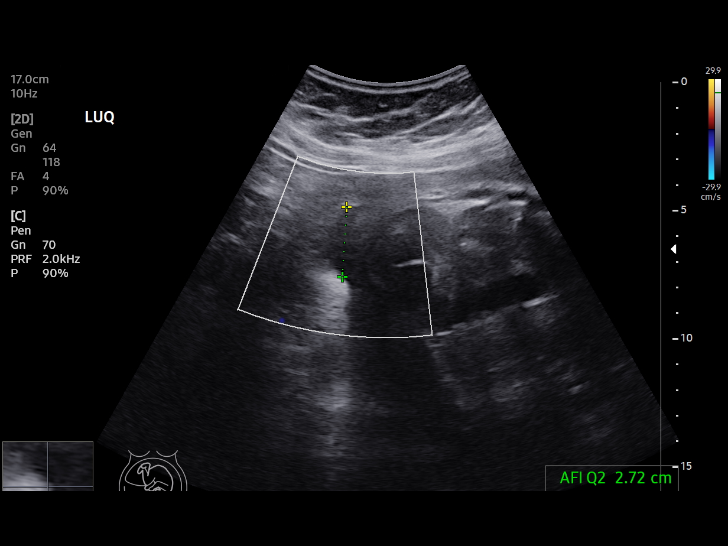
[im 10/12]
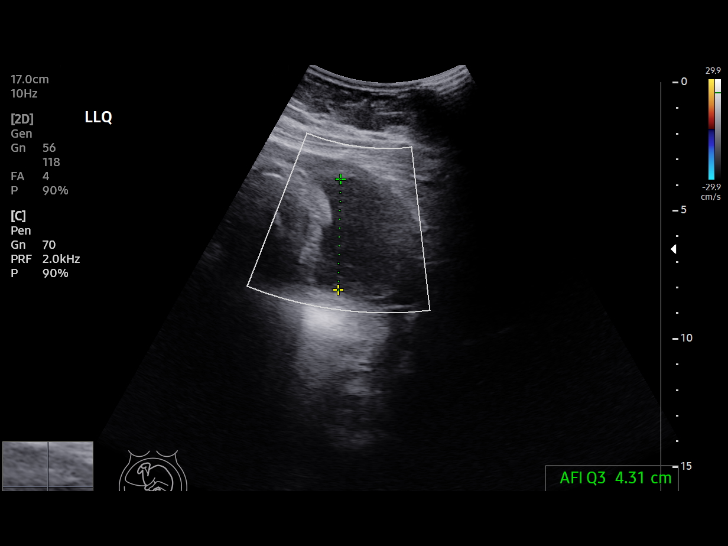
[im 11/12]
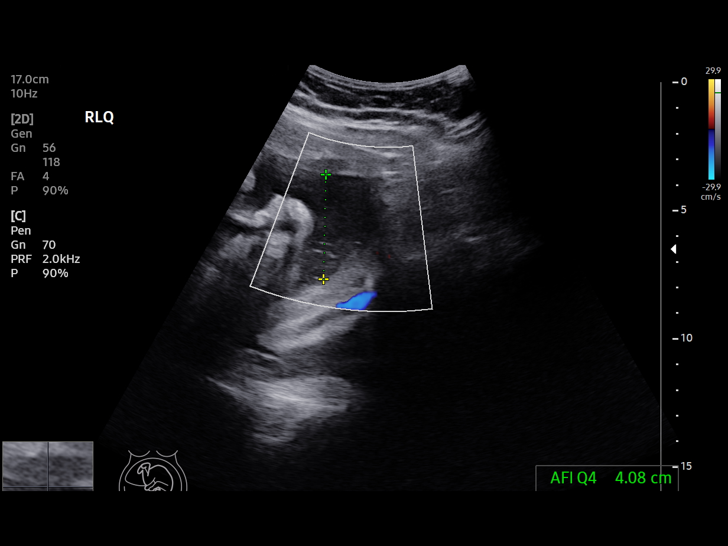
[im 12/12]
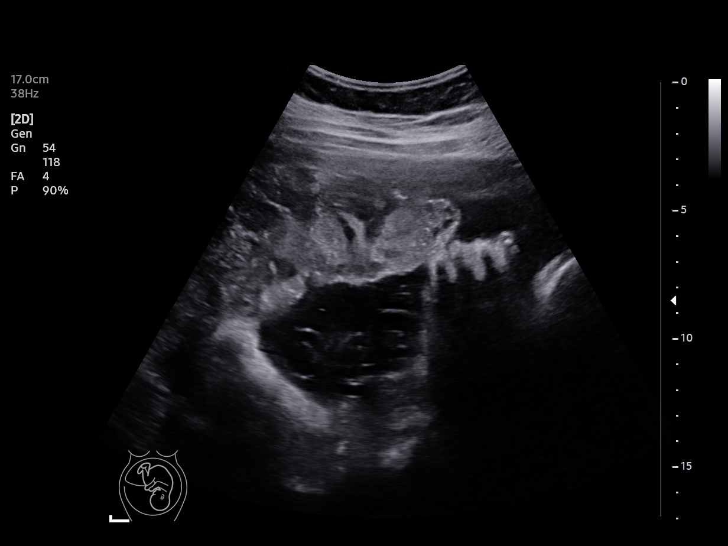

[12 of 12 positions shown; findings below may reference images not displayed]

[REDACTED]care at

 1  US FETAL BPP W/NONSTRESS              76818.4     JADILSON VALENDOLF

Service(s) Provided

Indications

 36 weeks gestation of pregnancy
 Pre-existing diabetes, type 2, in pregnancy,
 third trimester
Fetal Evaluation

 Num Of Fetuses:         1
 Preg. Location:         Intrauterine
 Cardiac Activity:       Observed
 Presentation:           Cephalic

 Amniotic Fluid
 AFI FV:      Within normal limits

 AFI Sum(cm)     %Tile       Largest Pocket(cm)
 13.35           49

 RUQ(cm)       RLQ(cm)       LUQ(cm)        LLQ(cm)

Biophysical Evaluation

 Amniotic F.V:   Pocket => 2 cm             F. Tone:        Observed
 F. Movement:    Observed                   N.S.T:          Reactive
 F. Breathing:   Not Observed               Score:          [DATE]
OB History

 Gravidity:    1
 Living:       0
Gestational Age

 LMP:           38w 5d        Date:  03/27/21                  EDD:   01/01/22
 Best:          36w 6d     Det. By:  Early Ultrasound         EDD:   01/14/22
                                     (05/25/21)
Impression

 [DATE] BPP, reassuring
Recommendations

 Continue ante testing and IOL at 37 wks
              Nava, Ofer

## 2022-11-17 ENCOUNTER — Ambulatory Visit: Payer: Medicaid Other | Admitting: Nurse Practitioner

## 2022-11-17 VITALS — BP 146/94 | HR 88 | Temp 98.0°F | Ht 62.0 in | Wt 186.5 lb

## 2022-11-17 DIAGNOSIS — Z794 Long term (current) use of insulin: Secondary | ICD-10-CM

## 2022-11-17 DIAGNOSIS — R030 Elevated blood-pressure reading, without diagnosis of hypertension: Secondary | ICD-10-CM | POA: Diagnosis not present

## 2022-11-17 DIAGNOSIS — E1165 Type 2 diabetes mellitus with hyperglycemia: Secondary | ICD-10-CM | POA: Diagnosis not present

## 2022-11-17 LAB — CBC
HCT: 47.7 % — ABNORMAL HIGH (ref 36.0–46.0)
Hemoglobin: 15.4 g/dL — ABNORMAL HIGH (ref 12.0–15.0)
MCHC: 32.3 g/dL (ref 30.0–36.0)
MCV: 79.7 fl (ref 78.0–100.0)
Platelets: 535 10*3/uL — ABNORMAL HIGH (ref 150.0–400.0)
RBC: 5.99 Mil/uL — ABNORMAL HIGH (ref 3.87–5.11)
RDW: 14.2 % (ref 11.5–15.5)
WBC: 6.7 10*3/uL (ref 4.0–10.5)

## 2022-11-17 LAB — LIPID PANEL
Cholesterol: 155 mg/dL (ref 0–200)
HDL: 49.7 mg/dL (ref >39.00)
LDL Cholesterol: 94 mg/dL (ref 0–99)
NonHDL: 105.43
Total CHOL/HDL Ratio: 3
Triglycerides: 56 mg/dL (ref 0.0–149.0)
VLDL: 11.2 mg/dL (ref 0.0–40.0)

## 2022-11-17 LAB — TSH: TSH: 0.98 u[IU]/mL (ref 0.35–5.50)

## 2022-11-17 LAB — COMPREHENSIVE METABOLIC PANEL
ALT: 15 U/L (ref 0–35)
AST: 13 U/L (ref 0–37)
Albumin: 4.5 g/dL (ref 3.5–5.2)
Alkaline Phosphatase: 98 U/L (ref 39–117)
BUN: 12 mg/dL (ref 6–23)
CO2: 23 mEq/L (ref 19–32)
Calcium: 9.8 mg/dL (ref 8.4–10.5)
Chloride: 102 mEq/L (ref 96–112)
Creatinine, Ser: 0.63 mg/dL (ref 0.40–1.20)
GFR: 125.85 mL/min (ref >60.00)
Glucose, Bld: 250 mg/dL — ABNORMAL HIGH (ref 70–99)
Potassium: 3.7 mEq/L (ref 3.5–5.1)
Sodium: 135 mEq/L (ref 135–145)
Total Bilirubin: 0.4 mg/dL (ref 0.2–1.2)
Total Protein: 8.1 g/dL (ref 6.0–8.3)

## 2022-11-17 LAB — HEMOGLOBIN A1C: Hgb A1c MFr Bld: 13.5 % — ABNORMAL HIGH (ref 4.6–6.5)

## 2022-11-17 MED ORDER — BLOOD GLUCOSE MONITOR KIT: PACK | 3 refills | Status: AC

## 2022-11-17 MED ORDER — BD PEN NEEDLE NANO U/F 32G X 4 MM MISC: 3 refills | Status: AC

## 2022-11-17 MED ORDER — INSULIN LISPRO (1 UNIT DIAL) 100 UNIT/ML (KWIKPEN): PEN_INJECTOR | SUBCUTANEOUS | 6 refills | Status: DC

## 2022-11-17 NOTE — Assessment & Plan Note (Signed)
Chronic, question whether or not she may actually be type I due to her insulin dependence.  She is in need of a refill of insulin.  Humalog KwikPen refilled to her pharmacy today, prescription for new glucometer also provided the patient.  For now she will continue to check blood sugar 4 times a day, 3 times before meals and before bedtime.  She will treat hypoglycemia with 1 unit of insulin to lower blood sugar by 30 points. Start to treat for blood sugar >150. She will also continue to administer insulin based on carb counts with meals.  1 unit of insulin per 10 g of carbs as she reports with her previous ratio.  She would like to restart OmniPod and Dexcom if possible, referral to endocrinology will be made today.  Labs also ordered for further evaluation, further recommendations may be made based upon his results.  Patient will follow-up in 2 weeks for close monitoring.  She was encouraged to bring all current medications that she is taking for review.

## 2022-11-17 NOTE — Patient Instructions (Addendum)
Dr. Steffanie Dunn in [place]: [phone]   Bring all medications that you are taking to your next visit.

## 2022-11-17 NOTE — Assessment & Plan Note (Signed)
Blood pressure elevated on exam today, not sure which antihypertensive she is taking currently.  She will bring all medications to next office visit for review.  She will follow-up in 2 to 4 weeks for close monitoring.

## 2022-11-17 NOTE — Progress Notes (Signed)
New Patient Office Visit  Subjective    Patient ID: Sherri Lopez, female    DOB: 2001-06-01  Age: 22 y.o. MRN: [id number]  CC:  Chief Complaint  Patient presents with   New Patient (Initial Visit)    Want to talk about her diabetes, type 2.    HPI Sherri Lopez presents to establish care.  Has had issues with access to care due to insurance barriers, now has insurance so is establishing today. Her main concern includes diabetes. Reports she was diagnosed with type 2 diabetes at age 10 around 2017.  Has been on mainly insulin therapy.  Reports initially was on basal/bolus regimen.  Has also been on Dexcom with OmniPod in the past.  Currently does not have a meter as hers broke.  Tells me that her insulin ratios are 1 unit of fast acting insulin per 10 g of carbs and 1 unit of fast acting insulin will lower her blood sugar by 30 points.  She is not sure what other medications she is taking.  Per chart review looks like she has been prescribed enalapril and nifedipine, she is not sure which one she is on currently.  I suspect she was changed from enalapril to nifedipine for her recent pregnancy.  She does report that she is on birth control currently.  Outpatient Encounter Medications as of 11/17/2022  Medication Sig   blood glucose meter kit and supplies KIT Use to check blood sugar before meals and at bedtime   enalapril (VASOTEC) 5 MG tablet Take 1 tablet (5 mg total) by mouth daily.   ibuprofen (ADVIL) 600 MG tablet Take 1 tablet (600 mg total) by mouth every 6 (six) hours.   insulin lispro (HUMALOG KWIKPEN) 100 UNIT/ML KwikPen Inject insulin units based on your current ratios. Check blood sugar prior to each meal and at bedtime.   Vitamin D, Ergocalciferol, (DRISDOL) 50000 units CAPS capsule TAKE 1 CAPSULE (50,000 UNITS TOTAL) BY MOUTH EVERY 7 (SEVEN) DAYS.   [DISCONTINUED] insulin lispro (HUMALOG) 100 UNIT/ML injection For continuous infusion in Omnipod. To be adjusted per Provider.  Currently on 10.32 (0.43/hr) units daily plus priming. CHO ratio-1 unit: 10 CHO's   [DISCONTINUED] Insulin Pen Needle (BD PEN NEEDLE NANO U/F) 32G X 4 MM MISC USE TO INJECT INSULIN VIA INSULIN PEN 6 TIMES DAILY   glucagon 1 MG injection Use for Severe Hypoglycemia . Inject 1 mg intramuscularly if unresponsive, unable to swallow, unconscious and/or has seizure (Patient not taking: Reported on 11/17/2022)   Insulin Pen Needle (BD PEN NEEDLE NANO U/F) 32G X 4 MM MISC USE TO INJECT INSULIN VIA INSULIN PEN 6 TIMES DAILY   NIFEdipine (ADALAT CC) 60 MG 24 hr tablet Take 1 tablet (60 mg total) by mouth 2 (two) times daily.   norethindrone (ORTHO MICRONOR) 0.35 MG tablet Take 1 tablet (0.35 mg total) by mouth daily.   [DISCONTINUED] Accu-Chek FastClix Lancets MISC 1 each by Does not apply route as directed. Check sugar 6 x daily   [DISCONTINUED] acetaminophen (TYLENOL) 325 MG tablet Take 2 tablets (650 mg total) by mouth every 4 (four) hours as needed (for pain scale < 4). (Patient not taking: Reported on 11/17/2022)   [DISCONTINUED] Continuous Blood Gluc Receiver (FREESTYLE LIBRE 2 READER) DEVI    [DISCONTINUED] Continuous Blood Gluc Sensor (FREESTYLE LIBRE 3 SENSOR) MISC 1 each by Does not apply route every 14 (fourteen) days. Place 1 sensor on the skin every 14 days. Use to check glucose continuously   [DISCONTINUED]  Continuous Blood Gluc Transmit (DEXCOM G6 TRANSMITTER) MISC 1 Device by Does not apply route as needed.   [DISCONTINUED] glucose blood test strip Use as instructed   [DISCONTINUED] metFORMIN (GLUCOPHAGE XR) 750 MG 24 hr tablet Take 1 tablet (750 mg total) by mouth 2 (two) times daily. Take 1 tablet twice daily (Patient not taking: Reported on 11/17/2022)   No facility-administered encounter medications on file as of 11/17/2022.    Past Medical History:  Diagnosis Date   Asthma    Diabetes mellitus without complication ([place])    Hypertension     No past surgical history on file.  Family  History  Adopted: Yes  Problem Relation Age of Onset   Polycystic ovary syndrome Mother    Arthritis Mother    Thyroid disease Mother    Hearing loss Father    Lupus Maternal Aunt    Diabetes type II Paternal Grandmother     Social History   Socioeconomic History   Marital status: Single    Spouse name: Not on file   Number of children: Not on file   Years of education: Not on file   Highest education level: Not on file  Occupational History   Not on file  Tobacco Use   Smoking status: Never   Smokeless tobacco: Never  Vaping Use   Vaping Use: Former   Start date: 07/26/2016  Substance and Sexual Activity   Alcohol use: No    Alcohol/week: 0.0 standard drinks of alcohol   Drug use: No   Sexual activity: Yes  Other Topics Concern   Not on file  Social History Narrative   Lives with both parents and 3 siblings.  Attends [employer], 12th grade. Applying to colleges, thinking about [place].  Wants to join the [employer] to be an Chief Financial Officer.  No sports or other activities.  Likes to watch TV and read books.  Likes crime show. Likes [employer].        Confidentiality was discussed with the patient and if applicable, with caregiver as well.      Patient's personal or confidential phone number: [phone]   Tobacco?  no   Drugs/ETOH?  no   Partner preference?  female Sexually Active?  no    Pregnancy Prevention:  none, reviewed condoms & plan B   Safe at home, in school & in relationships?  yes   Safe to self?   yes   Guns in the home?  no       Social Determinants of Radio broadcast assistant Strain: Not on file  Food Insecurity: Not on file  Transportation Needs: Not on file  Physical Activity: Not on file  Stress: Not on file  Social Connections: Not on file  Intimate Partner Violence: Not on file    Review of Systems  Cardiovascular:  Negative for chest pain.  Musculoskeletal:  Positive for myalgias (intermittent).  Neurological:  Negative for  dizziness.        Objective    BP (!) 146/94   Pulse 88   Temp 98 F (36.7 C) (Temporal)   Ht 5\' 2"  (1.575 m)   Wt 186 lb 8 oz (84.6 kg)   LMP 11/07/2022   SpO2 95%   BMI 34.11 kg/m   Physical Exam Vitals reviewed.  Constitutional:      General: She is not in acute distress.    Appearance: Normal appearance.  HENT:     Head: Normocephalic and atraumatic.  Neck:  Vascular: No carotid bruit.  Cardiovascular:     Rate and Rhythm: Normal rate and regular rhythm.     Pulses: Normal pulses.     Heart sounds: Normal heart sounds.  Pulmonary:     Effort: Pulmonary effort is normal.     Breath sounds: Normal breath sounds.  Skin:    General: Skin is warm and dry.  Neurological:     General: No focal deficit present.     Mental Status: She is alert and oriented to person, place, and time.  Psychiatric:        Mood and Affect: Mood normal.        Behavior: Behavior normal.        Judgment: Judgment normal.         Assessment & Plan:   Problem List Items Addressed This Visit       Endocrine   Type 2 diabetes mellitus - Primary    Chronic, question whether or not she may actually be type I due to her insulin dependence.  She is in need of a refill of insulin.  Humalog KwikPen refilled to her pharmacy today, prescription for new glucometer also provided the patient.  For now she will continue to check blood sugar 4 times a day, 3 times before meals and before bedtime.  She will treat hypoglycemia with 1 unit of insulin to lower blood sugar by 30 points. Start to treat for blood sugar >150. She will also continue to administer insulin based on carb counts with meals.  1 unit of insulin per 10 g of carbs as she reports with her previous ratio.  She would like to restart OmniPod and Dexcom if possible, referral to endocrinology will be made today.  Labs also ordered for further evaluation, further recommendations may be made based upon his results.  Patient will follow-up  in 2 weeks for close monitoring.  She was encouraged to bring all current medications that she is taking for review.      Relevant Medications   blood glucose meter kit and supplies KIT   insulin lispro (HUMALOG KWIKPEN) 100 UNIT/ML KwikPen   Insulin Pen Needle (BD PEN NEEDLE NANO U/F) 32G X 4 MM MISC   Other Relevant Orders   Ambulatory referral to Endocrinology   CBC   Comprehensive metabolic panel   Hemoglobin A1c   Lipid panel   TSH     Other   Elevated blood pressure reading    Blood pressure elevated on exam today, not sure which antihypertensive she is taking currently.  She will bring all medications to next office visit for review.  She will follow-up in 2 to 4 weeks for close monitoring.       Return in about 2 weeks (around 12/01/2022) for 2-4 weeks F/U with Judson Roch.   Ailene Ards, NP

## 2022-12-01 ENCOUNTER — Telehealth: Payer: Self-pay | Admitting: Nurse Practitioner

## 2022-12-01 DIAGNOSIS — E1165 Type 2 diabetes mellitus with hyperglycemia: Secondary | ICD-10-CM

## 2022-12-01 NOTE — Telephone Encounter (Signed)
Pharmacist need to know what ratio " Inject insulin units based on your current ratios ".Marland KitchenRaechel Chute

## 2022-12-01 NOTE — Telephone Encounter (Signed)
Pharmacist from [facility] on [address] called and asked for clarification on insulin lispro (HUMALOG KWIKPEN) 100 UNIT/ML KwikPen. Please callback at [phone].

## 2022-12-02 ENCOUNTER — Other Ambulatory Visit: Payer: Self-pay | Admitting: Nurse Practitioner

## 2022-12-02 MED ORDER — INSULIN LISPRO (1 UNIT DIAL) 100 UNIT/ML (KWIKPEN)
PEN_INJECTOR | SUBCUTANEOUS | 6 refills | Status: AC
Start: 2022-12-02 — End: ?

## 2022-12-02 NOTE — Telephone Encounter (Signed)
Made pt aware of providers notes, pt had no further questions

## 2022-12-08 ENCOUNTER — Ambulatory Visit: Payer: Medicaid Other | Admitting: Nurse Practitioner

## 2023-02-17 ENCOUNTER — Encounter (INDEPENDENT_AMBULATORY_CARE_PROVIDER_SITE_OTHER): Payer: Self-pay

## 2023-05-08 DIAGNOSIS — Z3201 Encounter for pregnancy test, result positive: Secondary | ICD-10-CM | POA: Diagnosis not present

## 2023-05-08 DIAGNOSIS — Z8759 Personal history of other complications of pregnancy, childbirth and the puerperium: Secondary | ICD-10-CM | POA: Diagnosis not present

## 2023-05-08 DIAGNOSIS — I10 Essential (primary) hypertension: Secondary | ICD-10-CM | POA: Diagnosis not present

## 2023-05-08 DIAGNOSIS — Z719 Counseling, unspecified: Secondary | ICD-10-CM | POA: Diagnosis not present

## 2023-05-08 DIAGNOSIS — Z794 Long term (current) use of insulin: Secondary | ICD-10-CM | POA: Diagnosis not present

## 2023-05-08 DIAGNOSIS — E119 Type 2 diabetes mellitus without complications: Secondary | ICD-10-CM | POA: Diagnosis not present

## 2023-05-08 DIAGNOSIS — Z23 Encounter for immunization: Secondary | ICD-10-CM | POA: Diagnosis not present

## 2023-05-11 DIAGNOSIS — O24111 Pre-existing diabetes mellitus, type 2, in pregnancy, first trimester: Secondary | ICD-10-CM | POA: Diagnosis not present

## 2023-05-11 DIAGNOSIS — Z794 Long term (current) use of insulin: Secondary | ICD-10-CM | POA: Diagnosis not present

## 2023-05-11 DIAGNOSIS — E1165 Type 2 diabetes mellitus with hyperglycemia: Secondary | ICD-10-CM | POA: Diagnosis not present

## 2023-05-19 DIAGNOSIS — E119 Type 2 diabetes mellitus without complications: Secondary | ICD-10-CM | POA: Diagnosis not present

## 2023-05-19 DIAGNOSIS — Z794 Long term (current) use of insulin: Secondary | ICD-10-CM | POA: Diagnosis not present

## 2023-05-19 DIAGNOSIS — Z3A08 8 weeks gestation of pregnancy: Secondary | ICD-10-CM | POA: Diagnosis not present

## 2023-05-19 DIAGNOSIS — Z8759 Personal history of other complications of pregnancy, childbirth and the puerperium: Secondary | ICD-10-CM | POA: Diagnosis not present

## 2023-05-24 DIAGNOSIS — E119 Type 2 diabetes mellitus without complications: Secondary | ICD-10-CM | POA: Diagnosis not present

## 2023-05-24 DIAGNOSIS — Z8759 Personal history of other complications of pregnancy, childbirth and the puerperium: Secondary | ICD-10-CM | POA: Diagnosis not present

## 2023-05-24 DIAGNOSIS — Z794 Long term (current) use of insulin: Secondary | ICD-10-CM | POA: Diagnosis not present

## 2023-05-24 DIAGNOSIS — Z3490 Encounter for supervision of normal pregnancy, unspecified, unspecified trimester: Secondary | ICD-10-CM | POA: Diagnosis not present

## 2023-06-20 DIAGNOSIS — Z794 Long term (current) use of insulin: Secondary | ICD-10-CM | POA: Diagnosis not present

## 2023-06-20 DIAGNOSIS — E1165 Type 2 diabetes mellitus with hyperglycemia: Secondary | ICD-10-CM | POA: Diagnosis not present

## 2023-06-20 DIAGNOSIS — O24112 Pre-existing diabetes mellitus, type 2, in pregnancy, second trimester: Secondary | ICD-10-CM | POA: Diagnosis not present

## 2023-07-04 DIAGNOSIS — O99212 Obesity complicating pregnancy, second trimester: Secondary | ICD-10-CM | POA: Diagnosis not present

## 2023-07-04 DIAGNOSIS — Z36 Encounter for antenatal screening for chromosomal anomalies: Secondary | ICD-10-CM | POA: Diagnosis not present

## 2023-07-04 DIAGNOSIS — Z3A15 15 weeks gestation of pregnancy: Secondary | ICD-10-CM | POA: Diagnosis not present

## 2023-07-04 DIAGNOSIS — Z794 Long term (current) use of insulin: Secondary | ICD-10-CM | POA: Diagnosis not present

## 2023-07-04 DIAGNOSIS — O10912 Unspecified pre-existing hypertension complicating pregnancy, second trimester: Secondary | ICD-10-CM | POA: Diagnosis not present

## 2023-07-04 DIAGNOSIS — E119 Type 2 diabetes mellitus without complications: Secondary | ICD-10-CM | POA: Diagnosis not present

## 2023-07-04 DIAGNOSIS — O24112 Pre-existing diabetes mellitus, type 2, in pregnancy, second trimester: Secondary | ICD-10-CM | POA: Diagnosis not present

## 2023-07-04 DIAGNOSIS — O09892 Supervision of other high risk pregnancies, second trimester: Secondary | ICD-10-CM | POA: Diagnosis not present

## 2023-07-12 DIAGNOSIS — E109 Type 1 diabetes mellitus without complications: Secondary | ICD-10-CM | POA: Diagnosis not present

## 2023-08-01 DIAGNOSIS — Z3A19 19 weeks gestation of pregnancy: Secondary | ICD-10-CM | POA: Diagnosis not present

## 2023-08-01 DIAGNOSIS — Z794 Long term (current) use of insulin: Secondary | ICD-10-CM | POA: Diagnosis not present

## 2023-08-01 DIAGNOSIS — O10012 Pre-existing essential hypertension complicating pregnancy, second trimester: Secondary | ICD-10-CM | POA: Diagnosis not present

## 2023-08-01 DIAGNOSIS — O09892 Supervision of other high risk pregnancies, second trimester: Secondary | ICD-10-CM | POA: Diagnosis not present

## 2023-08-01 DIAGNOSIS — O99212 Obesity complicating pregnancy, second trimester: Secondary | ICD-10-CM | POA: Diagnosis not present

## 2023-08-01 DIAGNOSIS — E119 Type 2 diabetes mellitus without complications: Secondary | ICD-10-CM | POA: Diagnosis not present

## 2023-08-01 DIAGNOSIS — D271 Benign neoplasm of left ovary: Secondary | ICD-10-CM | POA: Diagnosis not present

## 2023-08-01 DIAGNOSIS — O24112 Pre-existing diabetes mellitus, type 2, in pregnancy, second trimester: Secondary | ICD-10-CM | POA: Diagnosis not present

## 2023-08-01 DIAGNOSIS — Z36 Encounter for antenatal screening for chromosomal anomalies: Secondary | ICD-10-CM | POA: Diagnosis not present

## 2023-08-11 DIAGNOSIS — E109 Type 1 diabetes mellitus without complications: Secondary | ICD-10-CM | POA: Diagnosis not present

## 2023-08-29 DIAGNOSIS — O10912 Unspecified pre-existing hypertension complicating pregnancy, second trimester: Secondary | ICD-10-CM | POA: Diagnosis not present

## 2023-08-29 DIAGNOSIS — O365920 Maternal care for other known or suspected poor fetal growth, second trimester, not applicable or unspecified: Secondary | ICD-10-CM | POA: Diagnosis not present

## 2023-08-29 DIAGNOSIS — Z36 Encounter for antenatal screening for chromosomal anomalies: Secondary | ICD-10-CM | POA: Diagnosis not present

## 2023-08-29 DIAGNOSIS — O24112 Pre-existing diabetes mellitus, type 2, in pregnancy, second trimester: Secondary | ICD-10-CM | POA: Diagnosis not present

## 2023-08-29 DIAGNOSIS — O99212 Obesity complicating pregnancy, second trimester: Secondary | ICD-10-CM | POA: Diagnosis not present

## 2023-08-29 DIAGNOSIS — Z3A23 23 weeks gestation of pregnancy: Secondary | ICD-10-CM | POA: Diagnosis not present

## 2023-08-29 DIAGNOSIS — E119 Type 2 diabetes mellitus without complications: Secondary | ICD-10-CM | POA: Diagnosis not present

## 2023-10-04 DIAGNOSIS — E109 Type 1 diabetes mellitus without complications: Secondary | ICD-10-CM | POA: Diagnosis not present

## 2023-10-17 DIAGNOSIS — Z794 Long term (current) use of insulin: Secondary | ICD-10-CM | POA: Diagnosis not present

## 2023-10-17 DIAGNOSIS — E1165 Type 2 diabetes mellitus with hyperglycemia: Secondary | ICD-10-CM | POA: Diagnosis not present

## 2023-10-17 DIAGNOSIS — Z3A30 30 weeks gestation of pregnancy: Secondary | ICD-10-CM | POA: Diagnosis not present

## 2023-10-17 DIAGNOSIS — O0933 Supervision of pregnancy with insufficient antenatal care, third trimester: Secondary | ICD-10-CM | POA: Diagnosis not present

## 2023-10-17 DIAGNOSIS — O99213 Obesity complicating pregnancy, third trimester: Secondary | ICD-10-CM | POA: Diagnosis not present

## 2023-10-17 DIAGNOSIS — O99212 Obesity complicating pregnancy, second trimester: Secondary | ICD-10-CM | POA: Diagnosis not present

## 2023-10-17 DIAGNOSIS — O09893 Supervision of other high risk pregnancies, third trimester: Secondary | ICD-10-CM | POA: Diagnosis not present

## 2023-10-17 DIAGNOSIS — O365930 Maternal care for other known or suspected poor fetal growth, third trimester, not applicable or unspecified: Secondary | ICD-10-CM | POA: Diagnosis not present

## 2023-10-17 DIAGNOSIS — E559 Vitamin D deficiency, unspecified: Secondary | ICD-10-CM | POA: Diagnosis not present

## 2023-10-17 DIAGNOSIS — Z3689 Encounter for other specified antenatal screening: Secondary | ICD-10-CM | POA: Diagnosis not present

## 2023-10-17 DIAGNOSIS — E119 Type 2 diabetes mellitus without complications: Secondary | ICD-10-CM | POA: Diagnosis not present

## 2023-10-17 DIAGNOSIS — O24113 Pre-existing diabetes mellitus, type 2, in pregnancy, third trimester: Secondary | ICD-10-CM | POA: Diagnosis not present

## 2023-10-17 DIAGNOSIS — O10913 Unspecified pre-existing hypertension complicating pregnancy, third trimester: Secondary | ICD-10-CM | POA: Diagnosis not present

## 2023-10-17 DIAGNOSIS — O162 Unspecified maternal hypertension, second trimester: Secondary | ICD-10-CM | POA: Diagnosis not present

## 2023-10-23 DIAGNOSIS — Z794 Long term (current) use of insulin: Secondary | ICD-10-CM | POA: Diagnosis not present

## 2023-10-23 DIAGNOSIS — O365930 Maternal care for other known or suspected poor fetal growth, third trimester, not applicable or unspecified: Secondary | ICD-10-CM | POA: Diagnosis not present

## 2023-10-23 DIAGNOSIS — O163 Unspecified maternal hypertension, third trimester: Secondary | ICD-10-CM | POA: Diagnosis not present

## 2023-10-23 DIAGNOSIS — E119 Type 2 diabetes mellitus without complications: Secondary | ICD-10-CM | POA: Diagnosis not present

## 2023-10-23 DIAGNOSIS — O99213 Obesity complicating pregnancy, third trimester: Secondary | ICD-10-CM | POA: Diagnosis not present

## 2023-10-23 DIAGNOSIS — O09893 Supervision of other high risk pregnancies, third trimester: Secondary | ICD-10-CM | POA: Diagnosis not present

## 2023-10-23 DIAGNOSIS — O24113 Pre-existing diabetes mellitus, type 2, in pregnancy, third trimester: Secondary | ICD-10-CM | POA: Diagnosis not present

## 2023-10-23 DIAGNOSIS — Z3A31 31 weeks gestation of pregnancy: Secondary | ICD-10-CM | POA: Diagnosis not present

## 2023-10-23 DIAGNOSIS — D271 Benign neoplasm of left ovary: Secondary | ICD-10-CM | POA: Diagnosis not present

## 2023-10-23 DIAGNOSIS — O0933 Supervision of pregnancy with insufficient antenatal care, third trimester: Secondary | ICD-10-CM | POA: Diagnosis not present

## 2023-10-23 DIAGNOSIS — O10013 Pre-existing essential hypertension complicating pregnancy, third trimester: Secondary | ICD-10-CM | POA: Diagnosis not present

## 2023-10-31 DIAGNOSIS — O365930 Maternal care for other known or suspected poor fetal growth, third trimester, not applicable or unspecified: Secondary | ICD-10-CM | POA: Diagnosis not present

## 2023-10-31 DIAGNOSIS — Z794 Long term (current) use of insulin: Secondary | ICD-10-CM | POA: Diagnosis not present

## 2023-10-31 DIAGNOSIS — O0933 Supervision of pregnancy with insufficient antenatal care, third trimester: Secondary | ICD-10-CM | POA: Diagnosis not present

## 2023-10-31 DIAGNOSIS — E119 Type 2 diabetes mellitus without complications: Secondary | ICD-10-CM | POA: Diagnosis not present

## 2023-10-31 DIAGNOSIS — Z3A32 32 weeks gestation of pregnancy: Secondary | ICD-10-CM | POA: Diagnosis not present

## 2023-10-31 DIAGNOSIS — O10913 Unspecified pre-existing hypertension complicating pregnancy, third trimester: Secondary | ICD-10-CM | POA: Diagnosis not present

## 2023-10-31 DIAGNOSIS — O09893 Supervision of other high risk pregnancies, third trimester: Secondary | ICD-10-CM | POA: Diagnosis not present

## 2023-10-31 DIAGNOSIS — O99213 Obesity complicating pregnancy, third trimester: Secondary | ICD-10-CM | POA: Diagnosis not present

## 2023-10-31 DIAGNOSIS — O24113 Pre-existing diabetes mellitus, type 2, in pregnancy, third trimester: Secondary | ICD-10-CM | POA: Diagnosis not present

## 2023-10-31 DIAGNOSIS — E6689 Other obesity not elsewhere classified: Secondary | ICD-10-CM | POA: Diagnosis not present

## 2023-11-21 DIAGNOSIS — O99213 Obesity complicating pregnancy, third trimester: Secondary | ICD-10-CM | POA: Diagnosis not present

## 2023-11-21 DIAGNOSIS — Z3689 Encounter for other specified antenatal screening: Secondary | ICD-10-CM | POA: Diagnosis not present

## 2023-11-21 DIAGNOSIS — O09893 Supervision of other high risk pregnancies, third trimester: Secondary | ICD-10-CM | POA: Diagnosis not present

## 2023-11-21 DIAGNOSIS — O365930 Maternal care for other known or suspected poor fetal growth, third trimester, not applicable or unspecified: Secondary | ICD-10-CM | POA: Diagnosis not present

## 2023-11-21 DIAGNOSIS — Z3A35 35 weeks gestation of pregnancy: Secondary | ICD-10-CM | POA: Diagnosis not present

## 2023-11-21 DIAGNOSIS — O0933 Supervision of pregnancy with insufficient antenatal care, third trimester: Secondary | ICD-10-CM | POA: Diagnosis not present

## 2023-11-21 DIAGNOSIS — O163 Unspecified maternal hypertension, third trimester: Secondary | ICD-10-CM | POA: Diagnosis not present

## 2023-11-21 DIAGNOSIS — E119 Type 2 diabetes mellitus without complications: Secondary | ICD-10-CM | POA: Diagnosis not present

## 2023-11-21 DIAGNOSIS — O24113 Pre-existing diabetes mellitus, type 2, in pregnancy, third trimester: Secondary | ICD-10-CM | POA: Diagnosis not present

## 2023-11-27 DIAGNOSIS — O24113 Pre-existing diabetes mellitus, type 2, in pregnancy, third trimester: Secondary | ICD-10-CM | POA: Diagnosis not present

## 2023-11-27 DIAGNOSIS — E119 Type 2 diabetes mellitus without complications: Secondary | ICD-10-CM | POA: Diagnosis not present

## 2023-11-27 DIAGNOSIS — Z794 Long term (current) use of insulin: Secondary | ICD-10-CM | POA: Diagnosis not present

## 2023-11-27 DIAGNOSIS — O99213 Obesity complicating pregnancy, third trimester: Secondary | ICD-10-CM | POA: Diagnosis not present

## 2023-11-27 DIAGNOSIS — Z3689 Encounter for other specified antenatal screening: Secondary | ICD-10-CM | POA: Diagnosis not present

## 2023-11-27 DIAGNOSIS — Z3A36 36 weeks gestation of pregnancy: Secondary | ICD-10-CM | POA: Diagnosis not present

## 2023-11-27 DIAGNOSIS — O403XX0 Polyhydramnios, third trimester, not applicable or unspecified: Secondary | ICD-10-CM | POA: Diagnosis not present

## 2023-11-27 DIAGNOSIS — O09893 Supervision of other high risk pregnancies, third trimester: Secondary | ICD-10-CM | POA: Diagnosis not present

## 2023-11-27 DIAGNOSIS — O163 Unspecified maternal hypertension, third trimester: Secondary | ICD-10-CM | POA: Diagnosis not present

## 2023-12-02 DIAGNOSIS — Z880 Allergy status to penicillin: Secondary | ICD-10-CM | POA: Diagnosis not present

## 2023-12-02 DIAGNOSIS — Z30430 Encounter for insertion of intrauterine contraceptive device: Secondary | ICD-10-CM | POA: Diagnosis not present

## 2023-12-02 DIAGNOSIS — O9952 Diseases of the respiratory system complicating childbirth: Secondary | ICD-10-CM | POA: Diagnosis not present

## 2023-12-02 DIAGNOSIS — O2412 Pre-existing diabetes mellitus, type 2, in childbirth: Secondary | ICD-10-CM | POA: Diagnosis not present

## 2023-12-02 DIAGNOSIS — E282 Polycystic ovarian syndrome: Secondary | ICD-10-CM | POA: Diagnosis not present

## 2023-12-02 DIAGNOSIS — O1414 Severe pre-eclampsia complicating childbirth: Secondary | ICD-10-CM | POA: Diagnosis not present

## 2023-12-02 DIAGNOSIS — J4520 Mild intermittent asthma, uncomplicated: Secondary | ICD-10-CM | POA: Diagnosis not present

## 2023-12-02 DIAGNOSIS — O1092 Unspecified pre-existing hypertension complicating childbirth: Secondary | ICD-10-CM | POA: Diagnosis not present

## 2023-12-02 DIAGNOSIS — O99214 Obesity complicating childbirth: Secondary | ICD-10-CM | POA: Diagnosis not present

## 2023-12-02 DIAGNOSIS — Z3A37 37 weeks gestation of pregnancy: Secondary | ICD-10-CM | POA: Diagnosis not present

## 2023-12-02 DIAGNOSIS — E109 Type 1 diabetes mellitus without complications: Secondary | ICD-10-CM | POA: Diagnosis not present

## 2023-12-02 DIAGNOSIS — O99824 Streptococcus B carrier state complicating childbirth: Secondary | ICD-10-CM | POA: Diagnosis not present

## 2023-12-03 DIAGNOSIS — Z3A37 37 weeks gestation of pregnancy: Secondary | ICD-10-CM | POA: Diagnosis not present

## 2023-12-03 DIAGNOSIS — Z30430 Encounter for insertion of intrauterine contraceptive device: Secondary | ICD-10-CM | POA: Diagnosis not present

## 2023-12-04 DIAGNOSIS — R000 Tachycardia, unspecified: Secondary | ICD-10-CM | POA: Diagnosis not present

## 2024-01-01 DIAGNOSIS — E109 Type 1 diabetes mellitus without complications: Secondary | ICD-10-CM | POA: Diagnosis not present

## 2024-01-31 DIAGNOSIS — E109 Type 1 diabetes mellitus without complications: Secondary | ICD-10-CM | POA: Diagnosis not present

## 2024-03-04 DIAGNOSIS — E109 Type 1 diabetes mellitus without complications: Secondary | ICD-10-CM | POA: Diagnosis not present

## 2024-03-19 DIAGNOSIS — J45909 Unspecified asthma, uncomplicated: Secondary | ICD-10-CM | POA: Diagnosis not present

## 2024-03-19 DIAGNOSIS — E1169 Type 2 diabetes mellitus with other specified complication: Secondary | ICD-10-CM | POA: Diagnosis not present

## 2024-03-19 DIAGNOSIS — I152 Hypertension secondary to endocrine disorders: Secondary | ICD-10-CM | POA: Diagnosis not present

## 2024-03-19 DIAGNOSIS — E1159 Type 2 diabetes mellitus with other circulatory complications: Secondary | ICD-10-CM | POA: Diagnosis not present

## 2024-03-19 DIAGNOSIS — E66812 Obesity, class 2: Secondary | ICD-10-CM | POA: Diagnosis not present

## 2024-03-19 DIAGNOSIS — Z794 Long term (current) use of insulin: Secondary | ICD-10-CM | POA: Diagnosis not present

## 2024-04-03 DIAGNOSIS — E109 Type 1 diabetes mellitus without complications: Secondary | ICD-10-CM | POA: Diagnosis not present

## 2024-05-04 DIAGNOSIS — E109 Type 1 diabetes mellitus without complications: Secondary | ICD-10-CM | POA: Diagnosis not present

## 2024-05-10 DIAGNOSIS — R1031 Right lower quadrant pain: Secondary | ICD-10-CM | POA: Diagnosis not present

## 2024-05-10 DIAGNOSIS — R252 Cramp and spasm: Secondary | ICD-10-CM | POA: Diagnosis not present

## 2024-05-17 DIAGNOSIS — R112 Nausea with vomiting, unspecified: Secondary | ICD-10-CM | POA: Diagnosis not present

## 2024-05-17 DIAGNOSIS — K2970 Gastritis, unspecified, without bleeding: Secondary | ICD-10-CM | POA: Diagnosis not present

## 2024-05-17 DIAGNOSIS — R197 Diarrhea, unspecified: Secondary | ICD-10-CM | POA: Diagnosis not present

## 2024-06-22 DIAGNOSIS — R1110 Vomiting, unspecified: Secondary | ICD-10-CM | POA: Diagnosis not present

## 2024-06-22 DIAGNOSIS — R197 Diarrhea, unspecified: Secondary | ICD-10-CM | POA: Diagnosis not present
